# Patient Record
Sex: Female | Born: 1965 | Race: Black or African American | Hispanic: No | Marital: Married | Smoking: Former smoker
Health system: Southern US, Community
[De-identification: ages and names within clinical notes are randomized; demographics above are authoritative.]

## PROBLEM LIST (undated history)

## (undated) DIAGNOSIS — G43909 Migraine, unspecified, not intractable, without status migrainosus: Secondary | ICD-10-CM

## (undated) DIAGNOSIS — B977 Papillomavirus as the cause of diseases classified elsewhere: Secondary | ICD-10-CM

## (undated) DIAGNOSIS — K219 Gastro-esophageal reflux disease without esophagitis: Secondary | ICD-10-CM

## (undated) DIAGNOSIS — M5430 Sciatica, unspecified side: Secondary | ICD-10-CM

## (undated) DIAGNOSIS — M719 Bursopathy, unspecified: Secondary | ICD-10-CM

## (undated) DIAGNOSIS — Z8669 Personal history of other diseases of the nervous system and sense organs: Secondary | ICD-10-CM

## (undated) DIAGNOSIS — F419 Anxiety disorder, unspecified: Secondary | ICD-10-CM

## (undated) DIAGNOSIS — L309 Dermatitis, unspecified: Secondary | ICD-10-CM

## (undated) DIAGNOSIS — Z9289 Personal history of other medical treatment: Secondary | ICD-10-CM

## (undated) DIAGNOSIS — R112 Nausea with vomiting, unspecified: Secondary | ICD-10-CM

## (undated) HISTORY — PX: TUBAL LIGATION: SHX77

## (undated) HISTORY — DX: Migraine, unspecified, not intractable, without status migrainosus: G43.909

## (undated) HISTORY — PX: NOVASURE ABLATION: SHX5394

## (undated) HISTORY — DX: Papillomavirus as the cause of diseases classified elsewhere: B97.7

## (undated) HISTORY — DX: Personal history of other medical treatment: Z92.89

## (undated) HISTORY — PX: ESOPHAGOGASTRODUODENOSCOPY: SHX1529

## (undated) HISTORY — PX: WISDOM TOOTH EXTRACTION: SHX21

## (undated) HISTORY — DX: Gastro-esophageal reflux disease without esophagitis: K21.9

## (undated) HISTORY — DX: Bursopathy, unspecified: M71.9

## (undated) HISTORY — PX: OTHER SURGICAL HISTORY: SHX169

---

## 2000-03-02 ENCOUNTER — Other Ambulatory Visit: Admission: RE | Admit: 2000-03-02 | Discharge: 2000-03-02 | Payer: Self-pay | Admitting: Obstetrics and Gynecology

## 2002-02-14 ENCOUNTER — Other Ambulatory Visit: Admission: RE | Admit: 2002-02-14 | Discharge: 2002-02-14 | Payer: Self-pay | Admitting: Gynecology

## 2002-06-20 ENCOUNTER — Observation Stay (HOSPITAL_COMMUNITY): Admission: AD | Admit: 2002-06-20 | Discharge: 2002-06-21 | Payer: Self-pay | Admitting: Gynecology

## 2002-06-20 ENCOUNTER — Encounter (INDEPENDENT_AMBULATORY_CARE_PROVIDER_SITE_OTHER): Payer: Self-pay | Admitting: *Deleted

## 2003-08-01 ENCOUNTER — Other Ambulatory Visit: Admission: RE | Admit: 2003-08-01 | Discharge: 2003-08-01 | Payer: Self-pay | Admitting: Obstetrics and Gynecology

## 2004-07-14 ENCOUNTER — Other Ambulatory Visit: Admission: RE | Admit: 2004-07-14 | Discharge: 2004-07-14 | Payer: Self-pay | Admitting: Obstetrics and Gynecology

## 2005-08-30 ENCOUNTER — Other Ambulatory Visit: Admission: RE | Admit: 2005-08-30 | Discharge: 2005-08-30 | Payer: Self-pay | Admitting: Obstetrics and Gynecology

## 2008-09-05 ENCOUNTER — Encounter: Admission: RE | Admit: 2008-09-05 | Discharge: 2008-09-05 | Payer: Self-pay | Admitting: Obstetrics and Gynecology

## 2008-10-11 ENCOUNTER — Encounter: Admission: RE | Admit: 2008-10-11 | Discharge: 2008-10-11 | Payer: Self-pay | Admitting: Obstetrics and Gynecology

## 2009-03-28 ENCOUNTER — Encounter: Admission: RE | Admit: 2009-03-28 | Discharge: 2009-03-28 | Payer: Self-pay | Admitting: Obstetrics and Gynecology

## 2009-09-17 ENCOUNTER — Encounter: Admission: RE | Admit: 2009-09-17 | Discharge: 2009-09-17 | Payer: Self-pay | Admitting: Obstetrics and Gynecology

## 2010-06-21 ENCOUNTER — Encounter: Payer: Self-pay | Admitting: Obstetrics and Gynecology

## 2010-08-18 ENCOUNTER — Other Ambulatory Visit: Payer: Self-pay | Admitting: Obstetrics and Gynecology

## 2010-08-18 DIAGNOSIS — N63 Unspecified lump in unspecified breast: Secondary | ICD-10-CM

## 2010-09-21 ENCOUNTER — Ambulatory Visit
Admission: RE | Admit: 2010-09-21 | Discharge: 2010-09-21 | Disposition: A | Discharge status: Home / Self Care | Payer: BC Managed Care – PPO | Source: Ambulatory Visit | Attending: Obstetrics and Gynecology | Admitting: Obstetrics and Gynecology

## 2010-09-21 ENCOUNTER — Other Ambulatory Visit: Payer: Self-pay | Admitting: Obstetrics and Gynecology

## 2010-09-21 DIAGNOSIS — N63 Unspecified lump in unspecified breast: Secondary | ICD-10-CM

## 2010-10-16 NOTE — Op Note (Signed)
NAME:  Julia Shelton, Julia Shelton NO.:  [id number]   MEDICAL RECORD NO.:  [id number]                  PATIENT TYPE:   LOCATION:                                       FACILITY:   PHYSICIAN:  Gretta Cool, M.D.              DATE OF BIRTH:   DATE OF PROCEDURE:  DATE OF DISCHARGE:                                 OPERATIVE REPORT   PREOPERATIVE DIAGNOSES:  Ectopic pregnancy, left, with hemoperitoneum.   POSTOPERATIVE DIAGNOSES:  1. Ectopic pregnancy, left, with hemoperitoneum.  2. Endometriosis with uteroperitoneal fistula.  3. History of tubal sterilization elsewhere in 1996.   PROCEDURE:  Diagnostic laparoscopy. Evacuation of hemoperitoneum. Left  partial salpingectomy. Filshie clip application to the left fallopian tube  stump. Lysis of right ovarian adhesions.   SURGEON:  Beather Arbour, M.D.   ANESTHESIA:  General orotracheal.   DESCRIPTION OF PROCEDURE:  Under excellent anesthesias as above with the  patient prepped and draped in Phalen stirrups, the Hulka tenaculum was  applied to the cervix and the bladder drained. A subumbilical incision was  made and cannula introduced into the peritoneum with 2 liters of carbon  dioxide. The laparoscope trochar was introduced and pelvic organs  visualized. The hemoperitoneum was confirmed as the left ectopic pregnancy  was identified in the distal segment of the left fallopian tube. The patient  had had previous sterilization procedure, apparently by cautery. There was a  long proximal segment of fallopian tube left on the left side. The right  side was apparently cauterized into the myometrium and there was no evidence  of residual fallopian tube proximal segment on the right. Accessory ports  were placed and tripolar forceps used to excise the distal segment of the  left fallopian tube. The clot was removed from around the left ovary and the  hemoperitoneum evacuated completely. Pelvis was irrigated to remove  all  debris. The left fallopian tube distal segment was removed through the 10 mm  port site. At this point, the __ tripolar forceps were used to excise  adhesions from the small bowel to the ovary and the stump of the right  fallopian tube at the previous tubal sterilization site. Careful examination  of the pelvis revealed evidence of uteroperitoneal reflux, menstrual blood,  and endometriosis in the cul-de-sac. At this point, careful examination of  all the pedicles revealed no intraperitoneal bleeding from the tubal  excision. The procedure was then terminated. Gas was allowed to escape and  approximately 500 cc of fluid left in the pelvis to prevent adhesion  formation. At this point, the incisions were closed with deep suture of 2-0  Vicryl and skin closure of 5-0 Vicryl and steri-strips. At the end of the  procedure, sponge and lap counts were correct. There were no complications.  The patient was returned to the recovery room  in excellent condition.  Gretta Cool, M.D.    CWL/MEDQ  D:  06/20/2002  T:  06/20/2002  Job:  [id number]

## 2011-08-23 ENCOUNTER — Other Ambulatory Visit: Payer: Self-pay | Admitting: Obstetrics and Gynecology

## 2011-08-23 DIAGNOSIS — Z1231 Encounter for screening mammogram for malignant neoplasm of breast: Secondary | ICD-10-CM

## 2011-09-23 ENCOUNTER — Ambulatory Visit
Admission: RE | Admit: 2011-09-23 | Discharge: 2011-09-23 | Disposition: A | Discharge status: Home / Self Care | Payer: BC Managed Care – PPO | Source: Ambulatory Visit | Attending: Obstetrics and Gynecology | Admitting: Obstetrics and Gynecology

## 2011-09-23 DIAGNOSIS — Z1231 Encounter for screening mammogram for malignant neoplasm of breast: Secondary | ICD-10-CM

## 2011-11-01 ENCOUNTER — Ambulatory Visit (INDEPENDENT_AMBULATORY_CARE_PROVIDER_SITE_OTHER): Payer: BC Managed Care – PPO | Admitting: Obstetrics and Gynecology

## 2011-11-01 ENCOUNTER — Encounter: Payer: Self-pay | Admitting: Obstetrics and Gynecology

## 2011-11-01 VITALS — BP 120/72 | HR 74 | Ht 64.0 in | Wt 175.0 lb

## 2011-11-01 DIAGNOSIS — M719 Bursopathy, unspecified: Secondary | ICD-10-CM | POA: Insufficient documentation

## 2011-11-01 DIAGNOSIS — N898 Other specified noninflammatory disorders of vagina: Secondary | ICD-10-CM

## 2011-11-01 DIAGNOSIS — A549 Gonococcal infection, unspecified: Secondary | ICD-10-CM | POA: Insufficient documentation

## 2011-11-01 DIAGNOSIS — N83209 Unspecified ovarian cyst, unspecified side: Secondary | ICD-10-CM | POA: Insufficient documentation

## 2011-11-01 DIAGNOSIS — O009 Unspecified ectopic pregnancy without intrauterine pregnancy: Secondary | ICD-10-CM | POA: Insufficient documentation

## 2011-11-01 DIAGNOSIS — Z8669 Personal history of other diseases of the nervous system and sense organs: Secondary | ICD-10-CM

## 2011-11-01 DIAGNOSIS — Z01419 Encounter for gynecological examination (general) (routine) without abnormal findings: Secondary | ICD-10-CM

## 2011-11-01 DIAGNOSIS — S92919A Unspecified fracture of unspecified toe(s), initial encounter for closed fracture: Secondary | ICD-10-CM | POA: Insufficient documentation

## 2011-11-01 DIAGNOSIS — IMO0002 Reserved for concepts with insufficient information to code with codable children: Secondary | ICD-10-CM | POA: Insufficient documentation

## 2011-11-01 DIAGNOSIS — K219 Gastro-esophageal reflux disease without esophagitis: Secondary | ICD-10-CM | POA: Insufficient documentation

## 2011-11-01 DIAGNOSIS — Z124 Encounter for screening for malignant neoplasm of cervix: Secondary | ICD-10-CM

## 2011-11-01 MED ORDER — AMBULATORY NON FORMULARY MEDICATION: 1.0000 | Status: DC

## 2011-11-01 NOTE — Progress Notes (Signed)
Regular Periods: yes Mammogram: yes  Monthly Breast Ex.: yes Exercise: yes  Tetanus < 10 years: no Seatbelts: yes  NI. Bladder Functn.: yes Abuse at home: no  Daily BM's: yes Stressful Work: yes  Healthy Diet: yes Sigmoid-Colonoscopy: NO  Calcium: no Medical problems this year: NO PROBLEMS   LAST PAP:5/12 NL  Contraception: BTL  Mammogram:  4/13 NL  PCP: DR. Aram Beecham WHITE  PMH: BROKEN TOE(RIGHT BIG TOE),BURSITIS IN HIP AND RIGHT SHOULDER  FMH: NO CHANGE  Last Bone Scan: NO

## 2011-11-01 NOTE — Progress Notes (Signed)
Subjective:    Julia Shelton is a 46 y.o. female, [id number], who presents for an annual exam. The patient has no complaints but due to history of recurrent bacterial vaginosis, wants to be checked for this.  Menstrual cycle:   LMP: Patient's last menstrual period was 10/21/2011.             Review of Systems Pertinent items are noted in HPI. Denies pelvic pain, urinary tract symptoms, vaginitis symptoms, irregular bleeding, menopausal symptoms, change in bowel habits or rectal bleeding   Objective:    BP 120/72  Pulse 74  Ht 5\' 4"  (1.626 m)  Wt 175 lb (79.379 kg)  BMI 30.04 kg/m2  LMP 10/21/2011   Wt Readings from Last 1 Encounters:  11/01/11 175 lb (79.379 kg)   Body mass index is 30.04 kg/(m^2). General Appearance: Alert, no acute distress HEENT: Grossly normal Neck / Thyroid: Supple, no thyromegaly or cervical adenopathy Lungs: Clear to auscultation bilaterally Back: No CVA tenderness Breast Exam: No masses or nodes.No dimpling, nipple retraction or discharge. Cardiovascular: Regular rate and rhythm.  Gastrointestinal: Soft, non-tender, no masses or organomegaly Pelvic Exam: EGBUS-wnl, vagina-normal rugae, cervix- without lesions or tenderness, uterus ULNS, adnexae-no masses or tenderness Rectovaginal: no masses and normal sphincter tone Lymphatic Exam: Non-palpable nodes in neck, clavicular,  axillary, or inguinal regions  Skin: no rashes or abnormalities Extremities: no clubbing cyanosis or edema  Neurologic: grossly normal Psychiatric: Alert and oriented  Wet Prep:  pH 5.0; whiff-negative; no clue, trich or yeast    Assessment:   Routine GYN Exam H/O Recurrent Bacterial Vaginosis Shift in vaginal pH   Plan:    PAP sent  RTO 1 year or prn  Boric Acid Capsules 600 mg  # 30 1 pv twice weekly 11rf  Tommie Bohlken,ELMIRAPA-C

## 2011-11-03 LAB — PAP IG W/ RFLX HPV ASCU

## 2011-12-22 ENCOUNTER — Encounter: Payer: Self-pay | Admitting: Obstetrics and Gynecology

## 2012-01-10 ENCOUNTER — Telehealth: Payer: Self-pay | Admitting: Obstetrics and Gynecology

## 2012-01-10 ENCOUNTER — Ambulatory Visit (INDEPENDENT_AMBULATORY_CARE_PROVIDER_SITE_OTHER): Payer: BC Managed Care – PPO | Admitting: Obstetrics and Gynecology

## 2012-01-10 ENCOUNTER — Encounter: Payer: Self-pay | Admitting: Obstetrics and Gynecology

## 2012-01-10 VITALS — BP 100/68 | HR 70 | Wt 174.0 lb

## 2012-01-10 DIAGNOSIS — N760 Acute vaginitis: Secondary | ICD-10-CM

## 2012-01-10 DIAGNOSIS — N899 Noninflammatory disorder of vagina, unspecified: Secondary | ICD-10-CM

## 2012-01-10 DIAGNOSIS — N898 Other specified noninflammatory disorders of vagina: Secondary | ICD-10-CM

## 2012-01-10 DIAGNOSIS — N762 Acute vulvitis: Secondary | ICD-10-CM

## 2012-01-10 LAB — POCT WET PREP (WET MOUNT): pH: 4.5

## 2012-01-10 MED ORDER — NYSTATIN-TRIAMCINOLONE 100000-0.1 UNIT/GM-% EX OINT: TOPICAL_OINTMENT | Freq: Two times a day (BID) | CUTANEOUS | Status: AC

## 2012-01-10 NOTE — Patient Instructions (Signed)
Avoid: - excess soap on genital area (consider using plain oatmeal soap) - use of powder or sprays in genital area - douching - wearing underwear to bed (except with menses) - using more than is directed detergent when washing clothes - tight fitting garments around genital area - excess sugar intake   

## 2012-01-10 NOTE — Progress Notes (Signed)
[name] complains of vaginal irritation x 1 week.  Admits to lounging in a wet swimsuit, but denies any actual vaginal discharge, odor, change in soaps/detergents.  Used some anti-itch cream with temporary results.  O:  Pelvic:  EGBUS-inflamed with mile edema, vagina-white discharge, cervix-no lesions, uterus-normal size; adnexae-no masses   Wet Prep;  pH-4.5 whiff-negative; no clue, trich or yeast  A: Vulvitis  P:  Mycolog II Cream (generic) topically on affected area bid x 7-14 days no refills       Perineal hygiene, use hands to bathe until irritation resolves along with oatmeal soap       RTO-as scheduled or prn.  Julia Wisner, PA-C

## 2012-01-10 NOTE — Progress Notes (Signed)
  Odor: no Itching:yes Thin:no Thick:no Fever:no Dyspareunia:no Hx PID:no HX STD:yes HPV Pelvic Pain:no Desires Gc/CT:no Desires HIV,RPR,HbsAG:no

## 2012-10-24 ENCOUNTER — Other Ambulatory Visit: Payer: Self-pay

## 2012-10-24 DIAGNOSIS — Z1231 Encounter for screening mammogram for malignant neoplasm of breast: Secondary | ICD-10-CM

## 2012-11-24 ENCOUNTER — Ambulatory Visit
Admission: RE | Admit: 2012-11-24 | Discharge: 2012-11-24 | Disposition: A | Discharge status: Home / Self Care | Payer: BC Managed Care – PPO | Source: Ambulatory Visit

## 2012-11-24 DIAGNOSIS — Z1231 Encounter for screening mammogram for malignant neoplasm of breast: Secondary | ICD-10-CM

## 2013-05-08 ENCOUNTER — Ambulatory Visit: Payer: BC Managed Care – PPO | Admitting: Podiatry

## 2013-11-13 ENCOUNTER — Other Ambulatory Visit: Payer: Self-pay

## 2013-11-13 DIAGNOSIS — Z1231 Encounter for screening mammogram for malignant neoplasm of breast: Secondary | ICD-10-CM

## 2013-12-07 ENCOUNTER — Ambulatory Visit: Payer: BC Managed Care – PPO

## 2013-12-28 ENCOUNTER — Encounter (INDEPENDENT_AMBULATORY_CARE_PROVIDER_SITE_OTHER): Payer: Self-pay

## 2013-12-28 ENCOUNTER — Ambulatory Visit
Admission: RE | Admit: 2013-12-28 | Discharge: 2013-12-28 | Disposition: A | Discharge status: Home / Self Care | Payer: BC Managed Care – PPO | Source: Ambulatory Visit

## 2013-12-28 DIAGNOSIS — Z1231 Encounter for screening mammogram for malignant neoplasm of breast: Secondary | ICD-10-CM

## 2014-02-09 ENCOUNTER — Emergency Department (HOSPITAL_COMMUNITY)
Admission: EM | Admit: 2014-02-09 | Discharge: 2014-02-09 | Disposition: A | Discharge status: Home / Self Care | Payer: BC Managed Care – PPO | Attending: Emergency Medicine | Admitting: Emergency Medicine

## 2014-02-09 ENCOUNTER — Encounter (HOSPITAL_COMMUNITY): Payer: Self-pay | Admitting: Emergency Medicine

## 2014-02-09 ENCOUNTER — Emergency Department (HOSPITAL_COMMUNITY): Payer: BC Managed Care – PPO

## 2014-02-09 ENCOUNTER — Emergency Department (INDEPENDENT_AMBULATORY_CARE_PROVIDER_SITE_OTHER)
Admission: EM | Admit: 2014-02-09 | Discharge: 2014-02-09 | Disposition: A | Discharge status: Home / Self Care | Payer: BC Managed Care – PPO | Source: Home / Self Care

## 2014-02-09 DIAGNOSIS — Z8781 Personal history of (healed) traumatic fracture: Secondary | ICD-10-CM | POA: Diagnosis not present

## 2014-02-09 DIAGNOSIS — Z8742 Personal history of other diseases of the female genital tract: Secondary | ICD-10-CM | POA: Diagnosis not present

## 2014-02-09 DIAGNOSIS — F3289 Other specified depressive episodes: Secondary | ICD-10-CM | POA: Insufficient documentation

## 2014-02-09 DIAGNOSIS — K219 Gastro-esophageal reflux disease without esophagitis: Secondary | ICD-10-CM

## 2014-02-09 DIAGNOSIS — Z8679 Personal history of other diseases of the circulatory system: Secondary | ICD-10-CM | POA: Diagnosis not present

## 2014-02-09 DIAGNOSIS — F411 Generalized anxiety disorder: Secondary | ICD-10-CM | POA: Insufficient documentation

## 2014-02-09 DIAGNOSIS — Z8619 Personal history of other infectious and parasitic diseases: Secondary | ICD-10-CM | POA: Diagnosis not present

## 2014-02-09 DIAGNOSIS — F329 Major depressive disorder, single episode, unspecified: Secondary | ICD-10-CM | POA: Insufficient documentation

## 2014-02-09 DIAGNOSIS — F419 Anxiety disorder, unspecified: Secondary | ICD-10-CM

## 2014-02-09 DIAGNOSIS — Z79899 Other long term (current) drug therapy: Secondary | ICD-10-CM | POA: Diagnosis not present

## 2014-02-09 DIAGNOSIS — Z8739 Personal history of other diseases of the musculoskeletal system and connective tissue: Secondary | ICD-10-CM | POA: Diagnosis not present

## 2014-02-09 LAB — CBC WITH DIFFERENTIAL/PLATELET
Basophils Absolute: 0 10*3/uL (ref 0.0–0.1)
Basophils Relative: 0 % (ref 0–1)
Eosinophils Absolute: 0.1 10*3/uL (ref 0.0–0.7)
Eosinophils Relative: 2 % (ref 0–5)
HCT: 36.9 % (ref 36.0–46.0)
Hemoglobin: 13.2 g/dL (ref 12.0–15.0)
LYMPHS ABS: 2.6 10*3/uL (ref 0.7–4.0)
LYMPHS PCT: 36 % (ref 12–46)
MCH: 30.5 pg (ref 26.0–34.0)
MCHC: 35.8 g/dL (ref 30.0–36.0)
MCV: 85.2 fL (ref 78.0–100.0)
Monocytes Absolute: 0.7 10*3/uL (ref 0.1–1.0)
Monocytes Relative: 9 % (ref 3–12)
NEUTROS ABS: 3.9 10*3/uL (ref 1.7–7.7)
NEUTROS PCT: 53 % (ref 43–77)
PLATELETS: 207 10*3/uL (ref 150–400)
RBC: 4.33 MIL/uL (ref 3.87–5.11)
RDW: 13 % (ref 11.5–15.5)
WBC: 7.3 10*3/uL (ref 4.0–10.5)

## 2014-02-09 LAB — BASIC METABOLIC PANEL
Anion gap: 14 (ref 5–15)
BUN: 8 mg/dL (ref 6–23)
CHLORIDE: 101 meq/L (ref 96–112)
CO2: 25 meq/L (ref 19–32)
Calcium: 9.4 mg/dL (ref 8.4–10.5)
Creatinine, Ser: 0.98 mg/dL (ref 0.50–1.10)
GFR calc Af Amer: 79 mL/min — ABNORMAL LOW (ref >90)
GFR, EST NON AFRICAN AMERICAN: 68 mL/min — AB (ref >90)
GLUCOSE: 90 mg/dL (ref 70–99)
POTASSIUM: 4.2 meq/L (ref 3.7–5.3)
SODIUM: 140 meq/L (ref 137–147)

## 2014-02-09 MED ORDER — SUCRALFATE 1 G PO TABS: 1.0000 g | ORAL_TABLET | Freq: Three times a day (TID) | ORAL | Status: DC

## 2014-02-09 MED ORDER — OMEPRAZOLE 20 MG PO CPDR: 20.0000 mg | DELAYED_RELEASE_CAPSULE | Freq: Two times a day (BID) | ORAL | Status: DC

## 2014-02-09 MED ORDER — FLUOXETINE HCL 10 MG PO CAPS: 10.0000 mg | ORAL_CAPSULE | Freq: Every day | ORAL | Status: DC

## 2014-02-09 MED ORDER — ALPRAZOLAM 0.5 MG PO TABS: 0.5000 mg | ORAL_TABLET | Freq: Every evening | ORAL | Status: DC | PRN

## 2014-02-09 NOTE — ED Notes (Signed)
Pt reports having multiple episodes of anxiety attack, which she reports she has a history of anxiety. Pt states that her anxiety has increased over the past few years. Pt also is requesting an evaluation of her gastric reflux which has also worsened, pt reports throat pain and has been taking medication, which has decreased the reflux. Pt states that she has had decreased appetite due to the reflux. Pt was sent from urgent care for evaluation.

## 2014-02-09 NOTE — ED Notes (Signed)
Pt is ambulatory upon discharge and verbalizing that she is leaving with all belongings that she arrived with.

## 2014-02-09 NOTE — Discharge Instructions (Signed)
Gastroesophageal Reflux Disease, Adult Gastroesophageal reflux disease (GERD) happens when acid from your stomach flows up into the esophagus. When acid comes in contact with the esophagus, the acid causes soreness (inflammation) in the esophagus. Over time, GERD may create small holes (ulcers) in the lining of the esophagus. CAUSES   Increased body weight. This puts pressure on the stomach, making acid rise from the stomach into the esophagus.  Smoking. This increases acid production in the stomach.  Drinking alcohol. This causes decreased pressure in the lower esophageal sphincter (valve or ring of muscle between the esophagus and stomach), allowing acid from the stomach into the esophagus.  Late evening meals and a full stomach. This increases pressure and acid production in the stomach.  A malformed lower esophageal sphincter. Sometimes, no cause is found. SYMPTOMS   Burning pain in the lower part of the mid-chest behind the breastbone and in the mid-stomach area. This may occur twice a week or more often.  Trouble swallowing.  Sore throat.  Dry cough.  Asthma-like symptoms including chest tightness, shortness of breath, or wheezing. DIAGNOSIS  Your caregiver may be able to diagnose GERD based on your symptoms. In some cases, X-rays and other tests may be done to check for complications or to check the condition of your stomach and esophagus. TREATMENT  Your caregiver may recommend over-the-counter or prescription medicines to help decrease acid production. Ask your caregiver before starting or adding any new medicines.  HOME CARE INSTRUCTIONS   Change the factors that you can control. Ask your caregiver for guidance concerning weight loss, quitting smoking, and alcohol consumption.  Avoid foods and drinks that make your symptoms worse, such as:  Caffeine or alcoholic drinks.  Chocolate.  Peppermint or mint flavorings.  Garlic and onions.  Spicy foods.  Citrus fruits,  such as oranges, lemons, or limes.  Tomato-based foods such as sauce, chili, salsa, and pizza.  Fried and fatty foods.  Avoid lying down for the 3 hours prior to your bedtime or prior to taking a nap.  Eat small, frequent meals instead of large meals.  Wear loose-fitting clothing. Do not wear anything tight around your waist that causes pressure on your stomach.  Raise the head of your bed 6 to 8 inches with wood blocks to help you sleep. Extra pillows will not help.  Only take over-the-counter or prescription medicines for pain, discomfort, or fever as directed by your caregiver.  Do not take aspirin, ibuprofen, or other nonsteroidal anti-inflammatory drugs (NSAIDs). SEEK IMMEDIATE MEDICAL CARE IF:   You have pain in your arms, neck, jaw, teeth, or back.  Your pain increases or changes in intensity or duration.  You develop nausea, vomiting, or sweating (diaphoresis).  You develop shortness of breath, or you faint.  Your vomit is green, yellow, black, or looks like coffee grounds or blood.  Your stool is red, bloody, or black. These symptoms could be signs of other problems, such as heart disease, gastric bleeding, or esophageal bleeding. MAKE SURE YOU:   Understand these instructions.  Will watch your condition.  Will get help right away if you are not doing well or get worse. Document Released: 02/24/2005 Document Revised: 08/09/2011 Document Reviewed: 12/04/2010 [facility] Patient Information 2015 [place], [place]. This information is not intended to replace advice given to you by your health care provider. Make sure you discuss any questions you have with your health care provider. Generalized Anxiety Disorder Generalized anxiety disorder (GAD) is a mental disorder. It interferes with life functions,  including relationships, work, and school. GAD is different from normal anxiety, which everyone experiences at some point in their lives in response to specific life events  and activities. Normal anxiety actually helps [place] prepare for and get through these life events and activities. Normal anxiety goes away after the event or activity is over.  GAD causes anxiety that is not necessarily related to specific events or activities. It also causes excess anxiety in proportion to specific events or activities. The anxiety associated with GAD is also difficult to control. GAD can vary from mild to severe. People with severe GAD can have intense waves of anxiety with physical symptoms (panic attacks).  SYMPTOMS The anxiety and worry associated with GAD are difficult to control. This anxiety and worry are related to many life events and activities and also occur more days than not for 6 months or longer. People with GAD also have three or more of the following symptoms (one or more in children):  Restlessness.   Fatigue.  Difficulty concentrating.   Irritability.  Muscle tension.  Difficulty sleeping or unsatisfying sleep. DIAGNOSIS GAD is diagnosed through an assessment by your health care provider. Your health care provider will ask you questions aboutyour mood,physical symptoms, and events in your life. Your health care provider may ask you about your medical history and use of alcohol or drugs, including prescription medicines. Your health care provider may also do a physical exam and blood tests. Certain medical conditions and the use of certain substances can cause symptoms similar to those associated with GAD. Your health care provider may refer you to a mental health specialist for further evaluation. TREATMENT The following therapies are usually used to treat GAD:   Medication. Antidepressant medication usually is prescribed for long-term daily control. Antianxiety medicines may be added in severe cases, especially when panic attacks occur.   Talk therapy (psychotherapy). Certain types of talk therapy can be helpful in treating GAD by providing support,  education, and guidance. A form of talk therapy called cognitive behavioral therapy can teach you healthy ways to think about and react to daily life events and activities.  Stress managementtechniques. These include yoga, meditation, and exercise and can be very helpful when they are practiced regularly. A mental health specialist can help determine which treatment is best for you. Some people see improvement with one therapy. However, other people require a combination of therapies. Document Released: 09/11/2012 Document Revised: 10/01/2013 Document Reviewed: 09/11/2012 [facility] Patient Information 2015 [place], [place]. This information is not intended to replace advice given to you by your health care provider. Make sure you discuss any questions you have with your health care provider. Outpatient Psychiatry and Counseling  [facility]: Mobile Crisis Management 24 hours:  [phone]  [facility] of the [employer] sliding scale fee and walk in schedule: M-F 8am-12pm/1pm-3pm [address]  [place], [place] [address] [phone]  [facility] [address] [place], [place] [address] [phone]  [facility] ([facility] [employer])- new patient walk-in appointments available Monday - Friday 8am -3pm.          [address] [place], [place] [address] [phone] or crisis line- [phone]  [facility] Outpatient Services/ Intensive Outpatient Therapy Program [address] [place], [place] [address] [phone]  [facility] Mental Health                  Crisis Services      [phone] N[address]     [place], [place] [address]                 [facility]   [facility] [phone]. [address] [place], [place] [address]   [employer] of Care          [address] [place]  [place], [place] [address]       [phone]  [facility] [address] [place], [place] [address] [phone]  [facility]    [address]    [place], [place] [address]     [phone]       Andee Poles, MD     [address]     [place] [address]     [phone]       [facility] [address] [place] [address]  [facility]     [address] [place], [place]      [phone]       [facility] Eulogio Ditch, MD [address] [place], [place] [address] [phone]  [facility]     [address]     [place], [place] [address]     [phone]       [facility] [address] [place], [place] [address] [phone] Resources for Temporary Residential Assistance/Crisis Centers  [facility]) M-F 8am-3pm   [address] [place], [place] [address]   [phone] Services include: laundry, barbering, support groups, case management, phone  & computer access, showers, AA/NA mtgs, mental health/substance abuse nurse, job skills class, disability information, VA assistance, spiritual classes, etc.   HOMELESS SHELTERS  [facility]     [facility]   [address], [place]     [phone]              [employer] (women and children)       [address] [place], [place] [address] [phone] [email or web address] for application and process Application Required  [facility]   [address]    [place] [address]     [phone]                    [facility] of [place] [address] [place], [place] [address] [phone] application appt.) Application Required  [facility] (women only)    [address]     [place], [place] [address]     [phone]      Intake starts 6pm daily Need valid ID, SSC, & Police  report [employer] [address] [place], [place] [phone] Application Required  [employer] (men only)     [address]      [place], [place]     [phone]       [facility] [place] (Pregnant women only) [address] [place], [place] [phone]  [facility]      [address]      [place], [place] [address]     [phone]             [facility] [address] [place], [place] [phone] 90 day commitment/SA/Application process  [facility](men only)  [address]     [place], [place]     [phone]       Check-in at [facility] of [facility] [address] [place], [place] [address] [phone] Men/Women/Women and Children must be there by 7 pm  [facility] [place], [place] [phone]

## 2014-02-09 NOTE — ED Notes (Signed)
MD Lynelle Doctor At bedside.

## 2014-02-09 NOTE — ED Provider Notes (Signed)
CSN: [id number]     Arrival date & time 02/09/14  1601 History   First MD Initiated Contact with Patient 02/09/14 1712     Chief Complaint  Patient presents with  . Gastrophageal Reflux  . Anxiety   HPI The patient has had trouble with anxiety for a number of years. She has never seen anyone in the past for specific treatment. Over the last couple of weeks her symptoms have gotten worse especially in the last 2 days. She'll have episodes of crying spells. She will feel very anxious. She will also start to feel that she can't catch her breath. His episodes lasting minutes at a time. Denies any trouble suicidal or homicidal ideation.  She went to an urgent care today. The patient states she was told to come to the emergency room and decide what sort of medication she should be on.  Patient has also had issues with acid reflux. Patient states the symptoms are for discomfort and fullness in the center of the chest. She denies any abdominal pain. She has had decreased appetite. She denies any fevers constipation or diarrhea. Past Medical History  Diagnosis Date  . History of blood transfusion   . GERD (gastroesophageal reflux disease)   . H/O menorrhagia   . Migraines   . HPV in female   . Abnormal pap   . GC (gonococcus infection)   . Ectopic fetus   . Ovarian cyst   . Bursitis     RIGHT HIP AND SHOULDER  . Broken toe    Past Surgical History  Procedure Laterality Date  . Laparoscopy      D AND C  . Tubal ligation    . Wisdom tooth extraction    . Novasure ablation     Family History  Problem Relation Age of Onset  . Hyperlipidemia Mother   . Asthma Mother   . Diabetes Father   . Hypertension Father   . Sickle cell trait Daughter   . Celiac disease Son    History  Substance Use Topics  . Smoking status: Never Smoker   . Smokeless tobacco: Never Used  . Alcohol Use: Yes     Comment: social   OB History   Grav Para Term Preterm Abortions TAB SAB Ect Mult Living   Review of Systems  All other systems reviewed and are negative.     Allergies  Biaxin  Home Medications   Prior to Admission medications   Medication Sig Start Date End Date Taking? Authorizing Provider  esomeprazole (NEXIUM) 40 MG capsule Take 40 mg by mouth daily at 12 noon.   Yes Historical Provider, MD  Multiple Vitamin (MULTIVITAMIN WITH MINERALS) TABS tablet Take 1 tablet by mouth daily.   Yes Historical Provider, MD  ALPRAZolam Prudy Feeler) 0.5 MG tablet Take 1 tablet (0.5 mg total) by mouth at bedtime as needed for anxiety. 02/09/14   Linwood Dibbles, MD  FLUoxetine (PROZAC) 10 MG capsule Take 1 capsule (10 mg total) by mouth daily. Follow up with your doctor about increasing the dose if needed after one week 02/09/14   Linwood Dibbles, MD  omeprazole (PRILOSEC) 20 MG capsule Take 1 capsule (20 mg total) by mouth 2 (two) times daily before a meal. 02/09/14   Linwood Dibbles, MD   BP 120/73  Pulse 74  Temp(Src) 97.8 F (36.6 C) (Oral)  Resp 16  SpO2 100%  LMP 02/05/2014  Physical Exam  Nursing note and vitals reviewed. Constitutional: She appears well-developed and well-nourished. No distress.  HENT:  Head: Normocephalic and atraumatic.  Right Ear: External ear normal.  Left Ear: External ear normal.  Eyes: Conjunctivae are normal. Right eye exhibits no discharge. Left eye exhibits no discharge. No scleral icterus.  Neck: Neck supple. No tracheal deviation present.  Cardiovascular: Normal rate, regular rhythm and intact distal pulses.   Pulmonary/Chest: Effort normal and breath sounds normal. No stridor. No respiratory distress. She has no wheezes. She has no rales.  Abdominal: Soft. Bowel sounds are normal. She exhibits no distension. There is no tenderness. There is no rebound and no guarding.  Musculoskeletal: She exhibits no edema and no tenderness.  Neurological: She is alert. She has normal strength. No cranial nerve deficit (no facial droop, extraocular movements intact, no  slurred speech) or sensory deficit. She exhibits normal muscle tone. She displays no seizure activity. Coordination normal.  Skin: Skin is warm and dry. No rash noted.  Psychiatric: Her mood appears not anxious. Her affect is not angry. Her speech is not slurred. She is not agitated and not slowed. She exhibits a depressed mood. She expresses no homicidal and no suicidal ideation. She expresses no suicidal plans and no homicidal plans. She is communicative.    ED Course  Procedures (including critical care time) Labs Review Labs Reviewed  BASIC METABOLIC PANEL - Abnormal; Notable for the following:    GFR calc non Af Amer 68 (*)    GFR calc Af Amer 79 (*)    All other components within normal limits  CBC WITH DIFFERENTIAL    Imaging Review Dg Chest 2 View  02/09/2014   CLINICAL DATA:  Anxiety.  Chest pain.  EXAM: CHEST  2 VIEW  COMPARISON:  None  FINDINGS: The heart size and mediastinal contours are within normal limits. Both lungs are clear. The visualized skeletal structures are unremarkable.  IMPRESSION: No active cardiopulmonary disease.   Electronically Signed   By: Signa Kell M.D.   On: 02/09/2014 18:29     EKG Interpretation   Date/Time:  Saturday February 09 2014 18:05:13 EDT Ventricular Rate:  73 PR Interval:  173 QRS Duration: 77 QT Interval:  391 QTC Calculation: 431 R Axis:   60 Text Interpretation:  Sinus rhythm Atrial premature complex Baseline  wander in lead(s) V6 No significant change since last tracing Confirmed by  Croy Drumwright  MD-J, Jaslyne Beeck ([id number]) on 02/09/2014 6:22:08 PM      MDM   Final diagnoses:  Gastroesophageal reflux disease, esophagitis presence not specified  Anxiety    Patient mentions GERD symptoms. I will discharge her home with a prescription for Prilosec.  I recommend follow up with her primary doctor.  Patient also has been having trouble with anxiety and depression. She is not suicidal or homicidal. She does not meet criteria for inpatient  hospitalization.  I will start her on a low dose of Prozac. Xanax when necessary. Give her a resource guide to establish outpatient followup with a psychiatrist or therapist    Linwood Dibbles, MD 02/09/14 1900

## 2014-02-09 NOTE — Discharge Instructions (Signed)
Go to [facility] for counselling and help with anxiety.

## 2014-02-09 NOTE — ED Notes (Signed)
Reports having crying spells.  Feeling overwhelmed.  Sob.  Denies any suicidal or homicidal thoughts.

## 2014-02-09 NOTE — ED Notes (Signed)
MD at bedside. 

## 2014-02-09 NOTE — ED Provider Notes (Signed)
CSN: [id number]     Arrival date & time 02/09/14  1444 History   None    Chief Complaint  Patient presents with  . Anxiety   (Consider location/radiation/quality/duration/timing/severity/associated sxs/prior Treatment) Patient is a 48 y.o. female presenting with anxiety. The history is provided by the patient.  Anxiety This is a new problem. The current episode started 2 days ago. The problem has been gradually worsening. Pertinent negatives include no chest pain and no abdominal pain. Associated symptoms comments: Not sleeping well, no prior hx of same.pt uncertain of cause--maybe hormones. Crying spells. No caffeine,no suicidal or homicidal thoughts.. She has tried nothing for the symptoms.    Past Medical History  Diagnosis Date  . History of blood transfusion   . GERD (gastroesophageal reflux disease)   . H/O menorrhagia   . Migraines   . HPV in female   . Abnormal pap   . GC (gonococcus infection)   . Ectopic fetus   . Ovarian cyst   . Bursitis     RIGHT HIP AND SHOULDER  . Broken toe    Past Surgical History  Procedure Laterality Date  . Laparoscopy      D AND C  . Tubal ligation    . Wisdom tooth extraction    . Novasure ablation     Family History  Problem Relation Age of Onset  . Hyperlipidemia Mother   . Asthma Mother   . Diabetes Father   . Hypertension Father   . Sickle cell trait Daughter   . Celiac disease Son    History  Substance Use Topics  . Smoking status: Never Smoker   . Smokeless tobacco: Never Used  . Alcohol Use: Yes     Comment: social   OB History   Grav Para Term Preterm Abortions TAB SAB Ect Mult Living   Review of Systems  Constitutional: Negative.   Cardiovascular: Negative for chest pain.  Gastrointestinal: Negative.  Negative for abdominal pain.  Psychiatric/Behavioral: Positive for sleep disturbance. Negative for suicidal ideas and self-injury. The patient is nervous/anxious.     Allergies   Biaxin  Home Medications   Prior to Admission medications   Medication Sig Start Date End Date Taking? Authorizing Provider  AMBULATORY NON FORMULARY MEDICATION Place 1 capsule vaginally 2 (two) times a week. Medication Name: Boric Acid Capsules 11/01/11   Henreitta Leber, PA-C  meloxicam (MOBIC) 7.5 MG tablet Take 7.5 mg by mouth daily.    Historical Provider, MD  Multiple Vitamin (MULTIVITAMIN) tablet Take 1 tablet by mouth daily.    Historical Provider, MD  Ranitidine & Diet Manage Prod (RANITIDINE-NUTRITIONAL SUPL) 150 MG MISC Take by mouth.    Historical Provider, MD   BP 116/79  Pulse 99  Temp(Src) 98.6 F (37 C) (Oral)  Resp 16  SpO2 99%  LMP 02/05/2014 Physical Exam  Nursing note and vitals reviewed. Constitutional: She is oriented to person, place, and time. She appears well-developed and well-nourished. She appears distressed.  Eyes: EOM are normal. Pupils are equal, round, and reactive to light.  Neck: Normal range of motion. Neck supple.  Cardiovascular: Normal heart sounds.   Pulmonary/Chest: Breath sounds normal.  Lymphadenopathy:    She has no cervical adenopathy.  Neurological: She is alert and oriented to person, place, and time. No cranial nerve deficit.  Skin: Skin is warm and dry.  Psychiatric: She has a normal mood and affect. Her behavior  is normal. Judgment and thought content normal.  Tearful ,depressed appearing.    ED Course  Procedures (including critical care time) Labs Review Labs Reviewed - No data to display  Imaging Review No results found.   MDM   1. Generalized anxiety disorder       Linna Hoff, MD 02/09/14 [phone]

## 2014-02-13 ENCOUNTER — Emergency Department (HOSPITAL_COMMUNITY): Payer: BC Managed Care – PPO

## 2014-02-13 ENCOUNTER — Encounter (HOSPITAL_COMMUNITY): Payer: Self-pay | Admitting: Emergency Medicine

## 2014-02-13 ENCOUNTER — Emergency Department (HOSPITAL_COMMUNITY)
Admission: EM | Admit: 2014-02-13 | Discharge: 2014-02-13 | Disposition: A | Discharge status: Home / Self Care | Payer: BC Managed Care – PPO | Attending: Emergency Medicine | Admitting: Emergency Medicine

## 2014-02-13 DIAGNOSIS — Z8742 Personal history of other diseases of the female genital tract: Secondary | ICD-10-CM | POA: Insufficient documentation

## 2014-02-13 DIAGNOSIS — Z8739 Personal history of other diseases of the musculoskeletal system and connective tissue: Secondary | ICD-10-CM | POA: Diagnosis not present

## 2014-02-13 DIAGNOSIS — Z8619 Personal history of other infectious and parasitic diseases: Secondary | ICD-10-CM | POA: Diagnosis not present

## 2014-02-13 DIAGNOSIS — Z9889 Other specified postprocedural states: Secondary | ICD-10-CM | POA: Insufficient documentation

## 2014-02-13 DIAGNOSIS — R748 Abnormal levels of other serum enzymes: Secondary | ICD-10-CM

## 2014-02-13 DIAGNOSIS — Z79899 Other long term (current) drug therapy: Secondary | ICD-10-CM | POA: Diagnosis not present

## 2014-02-13 DIAGNOSIS — Z87828 Personal history of other (healed) physical injury and trauma: Secondary | ICD-10-CM | POA: Insufficient documentation

## 2014-02-13 DIAGNOSIS — Z9851 Tubal ligation status: Secondary | ICD-10-CM | POA: Insufficient documentation

## 2014-02-13 DIAGNOSIS — K219 Gastro-esophageal reflux disease without esophagitis: Secondary | ICD-10-CM | POA: Diagnosis present

## 2014-02-13 LAB — COMPREHENSIVE METABOLIC PANEL
ALK PHOS: 46 U/L (ref 39–117)
ALT: 15 U/L (ref 0–35)
AST: 20 U/L (ref 0–37)
Albumin: 4.4 g/dL (ref 3.5–5.2)
Anion gap: 14 (ref 5–15)
BILIRUBIN TOTAL: 0.6 mg/dL (ref 0.3–1.2)
BUN: 5 mg/dL — ABNORMAL LOW (ref 6–23)
CALCIUM: 9.5 mg/dL (ref 8.4–10.5)
CO2: 26 meq/L (ref 19–32)
Chloride: 93 mEq/L — ABNORMAL LOW (ref 96–112)
Creatinine, Ser: 0.87 mg/dL (ref 0.50–1.10)
GFR, EST NON AFRICAN AMERICAN: 79 mL/min — AB (ref >90)
GLUCOSE: 95 mg/dL (ref 70–99)
Potassium: 3.6 mEq/L — ABNORMAL LOW (ref 3.7–5.3)
SODIUM: 133 meq/L — AB (ref 137–147)
Total Protein: 7.7 g/dL (ref 6.0–8.3)

## 2014-02-13 LAB — CBC WITH DIFFERENTIAL/PLATELET
Basophils Absolute: 0 10*3/uL (ref 0.0–0.1)
Basophils Relative: 0 % (ref 0–1)
EOS PCT: 0 % (ref 0–5)
Eosinophils Absolute: 0 10*3/uL (ref 0.0–0.7)
HEMATOCRIT: 38 % (ref 36.0–46.0)
Hemoglobin: 13.8 g/dL (ref 12.0–15.0)
LYMPHS ABS: 2.1 10*3/uL (ref 0.7–4.0)
LYMPHS PCT: 31 % (ref 12–46)
MCH: 30.8 pg (ref 26.0–34.0)
MCHC: 36.3 g/dL — ABNORMAL HIGH (ref 30.0–36.0)
MCV: 84.8 fL (ref 78.0–100.0)
Monocytes Absolute: 0.5 10*3/uL (ref 0.1–1.0)
Monocytes Relative: 8 % (ref 3–12)
Neutro Abs: 4.2 10*3/uL (ref 1.7–7.7)
Neutrophils Relative %: 61 % (ref 43–77)
PLATELETS: 251 10*3/uL (ref 150–400)
RBC: 4.48 MIL/uL (ref 3.87–5.11)
RDW: 13 % (ref 11.5–15.5)
WBC: 6.9 10*3/uL (ref 4.0–10.5)

## 2014-02-13 LAB — I-STAT TROPONIN, ED: Troponin i, poc: 0 ng/mL (ref 0.00–0.08)

## 2014-02-13 LAB — LIPASE, BLOOD: Lipase: 106 U/L — ABNORMAL HIGH (ref 11–59)

## 2014-02-13 MED ORDER — METOCLOPRAMIDE HCL 5 MG/ML IJ SOLN
10.0000 mg | Freq: Once | INTRAMUSCULAR | Status: AC
  Administered 2014-02-13: 10 mg via INTRAVENOUS
  Filled 2014-02-13: qty 2

## 2014-02-13 MED ORDER — LORAZEPAM 2 MG/ML IJ SOLN
1.0000 mg | Freq: Once | INTRAMUSCULAR | Status: DC
  Filled 2014-02-13: qty 1

## 2014-02-13 MED ORDER — SODIUM CHLORIDE 0.9 % IV BOLUS (SEPSIS)
1000.0000 mL | Freq: Once | INTRAVENOUS | Status: AC
  Administered 2014-02-13: 1000 mL via INTRAVENOUS

## 2014-02-13 NOTE — ED Notes (Addendum)
Pt states that she is taking nexium and zantac for her acid reflux, and she states that this does not help with her acid reflux. Pt states that she is only able to eat in the morning, and from then on she states that it feels like she is unable to swallow. Pt states that when she tries to swallow, she can feel the acid come up in her throat. Pt denies pain at this time, but feels like she can't swallow. Pt states that she has an appointment with a gastroenterologist tomorrow morning.

## 2014-02-13 NOTE — ED Notes (Signed)
Pt is in ultrasound.

## 2014-02-13 NOTE — ED Notes (Signed)
Pt reports hx of acid reflux, having severe reflux x 2 weeks and unable to eat any food. Was seen at [facility] on 9/12 for same and also anxiety related to symptoms. Has been taking meds as prescribed but reports no relief. Now has dry mouth and having difficulty swallowing. Airway is intact at triage and no acute distress noted.

## 2014-02-13 NOTE — ED Notes (Signed)
Pt has returned from ultrasound.  

## 2014-02-13 NOTE — Discharge Instructions (Signed)
Gastroesophageal Reflux Disease, Adult °Gastroesophageal reflux disease (GERD) happens when acid from your stomach goes into your food pipe (esophagus). The acid can cause a burning feeling in your chest. Over time, the acid can make small holes (ulcers) in your food pipe.  °HOME CARE °· Ask your doctor for advice about: °¨ Losing weight. °¨ Quitting smoking. °¨ Alcohol use. °· Avoid foods and drinks that make your problems worse. You may want to avoid: °¨ Caffeine and alcohol. °¨ Chocolate. °¨ Mints. °¨ Garlic and onions. °¨ Spicy foods. °¨ Citrus fruits, such as oranges, lemons, or limes. °¨ Foods that contain tomato, such as sauce, chili, salsa, and pizza. °¨ Fried and fatty foods. °· Avoid lying down for 3 hours before you go to bed or before you take a nap. °· Eat small meals often, instead of large meals. °· Wear loose-fitting clothing. Do not wear anything tight around your waist. °· Raise (elevate) the head of your bed 6 to 8 inches with wood blocks. Using extra pillows does not help. °· Only take medicines as told by your doctor. °· Do not take aspirin or ibuprofen. °GET HELP RIGHT AWAY IF:  °· You have pain in your arms, neck, jaw, teeth, or back. °· Your pain gets worse or changes. °· You feel sick to your stomach (nauseous), throw up (vomit), or sweat (diaphoresis). °· You feel short of breath, or you pass out (faint). °· Your throw up is green, yellow, black, or looks like coffee grounds or blood. °· Your poop (stool) is red, bloody, or black. °MAKE SURE YOU:  °· Understand these instructions. °· Will watch your condition. °· Will get help right away if you are not doing well or get worse. °Document Released: 11/03/2007 Document Revised: 08/09/2011 Document Reviewed: 12/04/2010 °[employer]® Patient Information ©2015 [employer]. This information is not intended to replace advice given to you by your health care provider. Make sure you discuss any questions you have with your health care provider. ° °

## 2014-02-15 ENCOUNTER — Other Ambulatory Visit: Payer: Self-pay | Admitting: Gastroenterology

## 2014-02-17 NOTE — ED Provider Notes (Signed)
CSN: [id number]     Arrival date & time 02/13/14  1529 History   First MD Initiated Contact with Patient 02/13/14 1955     Chief Complaint  Patient presents with  . Gastrophageal Reflux  . Dysphagia     HPI Pt states that she is taking medications her acid reflux, and she states that this does not help with her acid reflux. Pt states that she is only able to eat in the morning, and from then on she states that it feels like she is unable to swallow. Pt states that when she tries to swallow, she can feel the acid come up in her throat. Pt denies pain at this time, but feels like she can't swallow. Pt states that she has an appointment with a gastroenterologist tomorrow morning.  Patient denies significant alcohol use.  Has no history of gallstones.  Denies diarrhea.  Denies fever.  Past Medical History  Diagnosis Date  . History of blood transfusion   . GERD (gastroesophageal reflux disease)   . H/O menorrhagia   . Migraines   . HPV in female   . Abnormal pap   . GC (gonococcus infection)   . Ectopic fetus   . Ovarian cyst   . Bursitis     RIGHT HIP AND SHOULDER  . Broken toe    Past Surgical History  Procedure Laterality Date  . Laparoscopy      D AND C  . Tubal ligation    . Wisdom tooth extraction    . Novasure ablation     Family History  Problem Relation Age of Onset  . Hyperlipidemia Mother   . Asthma Mother   . Diabetes Father   . Hypertension Father   . Sickle cell trait Daughter   . Celiac disease Son    History  Substance Use Topics  . Smoking status: Never Smoker   . Smokeless tobacco: Never Used  . Alcohol Use: Yes     Comment: social   OB History   Grav Para Term Preterm Abortions TAB SAB Ect Mult Living   Review of Systems  All other systems reviewed and are negative.     Allergies  Biaxin  Home Medications   Prior to Admission medications   Medication Sig Start Date End Date Taking? Authorizing Provider  ALPRAZolam  Prudy Feeler) 0.5 MG tablet Take 0.5 mg by mouth at bedtime as needed for anxiety or sleep.   Yes Historical Provider, MD  esomeprazole (NEXIUM) 40 MG capsule Take 40 mg by mouth daily at 12 noon.   Yes Historical Provider, MD  FLUoxetine (PROZAC) 10 MG capsule Take 10 mg by mouth daily.   Yes Historical Provider, MD  Multiple Vitamin (MULTIVITAMIN WITH MINERALS) TABS tablet Take 1 tablet by mouth daily.   Yes Historical Provider, MD  sucralfate (CARAFATE) 1 G tablet Take 1 g by mouth 4 (four) times daily.   Yes Historical Provider, MD   BP 115/69  Pulse 86  Temp(Src) 97.7 F (36.5 C) (Oral)  Resp 16  SpO2 100%  LMP 02/05/2014 Physical Exam  Nursing note and vitals reviewed. Constitutional: She is oriented to person, place, and time. She appears well-developed and well-nourished. No distress.  HENT:  Head: Normocephalic and atraumatic.  Eyes: Pupils are equal, round, and reactive to light.  Neck: Normal range of motion.  Cardiovascular: Normal rate and intact distal pulses.   Pulmonary/Chest: No respiratory  distress.  Abdominal: Normal appearance. She exhibits no distension. There is no tenderness. There is no rebound and no guarding.  Musculoskeletal: Normal range of motion.  Neurological: She is alert and oriented to person, place, and time. No cranial nerve deficit.  Skin: Skin is warm and dry. No rash noted.  Psychiatric: She has a normal mood and affect. Her behavior is normal.    ED Course  Procedures (including critical care time) Labs Review Labs Reviewed  CBC WITH DIFFERENTIAL - Abnormal; Notable for the following:    MCHC 36.3 (*)    All other components within normal limits  COMPREHENSIVE METABOLIC PANEL - Abnormal; Notable for the following:    Sodium 133 (*)    Potassium 3.6 (*)    Chloride 93 (*)    BUN 5 (*)    GFR calc non Af Amer 79 (*)    All other components within normal limits  LIPASE, BLOOD - Abnormal; Notable for the following:    Lipase 106 (*)    All  other components within normal limits  I-STAT TROPOININ, ED    Imaging Review Ultrasound abdomen is negative for any acute intra-abdominal pathology    MDM   Final diagnoses:  Elevated lipase  Gastroesophageal reflux disease, esophagitis presence not specified        Nelia Shi, MD 02/17/14 [phone]

## 2014-02-18 ENCOUNTER — Emergency Department (HOSPITAL_COMMUNITY): Payer: BC Managed Care – PPO

## 2014-02-18 ENCOUNTER — Encounter (HOSPITAL_COMMUNITY): Payer: Self-pay | Admitting: Emergency Medicine

## 2014-02-18 ENCOUNTER — Emergency Department (HOSPITAL_COMMUNITY)
Admission: EM | Admit: 2014-02-18 | Discharge: 2014-02-19 | Disposition: A | Discharge status: Home / Self Care | Payer: BC Managed Care – PPO | Attending: Emergency Medicine | Admitting: Emergency Medicine

## 2014-02-18 ENCOUNTER — Other Ambulatory Visit: Payer: Self-pay | Admitting: Gastroenterology

## 2014-02-18 DIAGNOSIS — N12 Tubulo-interstitial nephritis, not specified as acute or chronic: Secondary | ICD-10-CM

## 2014-02-18 DIAGNOSIS — R748 Abnormal levels of other serum enzymes: Secondary | ICD-10-CM

## 2014-02-18 DIAGNOSIS — Z792 Long term (current) use of antibiotics: Secondary | ICD-10-CM | POA: Diagnosis not present

## 2014-02-18 DIAGNOSIS — Z8679 Personal history of other diseases of the circulatory system: Secondary | ICD-10-CM | POA: Insufficient documentation

## 2014-02-18 DIAGNOSIS — Z79899 Other long term (current) drug therapy: Secondary | ICD-10-CM | POA: Diagnosis not present

## 2014-02-18 DIAGNOSIS — Z8739 Personal history of other diseases of the musculoskeletal system and connective tissue: Secondary | ICD-10-CM | POA: Diagnosis not present

## 2014-02-18 DIAGNOSIS — Z87828 Personal history of other (healed) physical injury and trauma: Secondary | ICD-10-CM | POA: Diagnosis not present

## 2014-02-18 DIAGNOSIS — Z9889 Other specified postprocedural states: Secondary | ICD-10-CM | POA: Diagnosis not present

## 2014-02-18 DIAGNOSIS — Z8742 Personal history of other diseases of the female genital tract: Secondary | ICD-10-CM | POA: Insufficient documentation

## 2014-02-18 DIAGNOSIS — Z9851 Tubal ligation status: Secondary | ICD-10-CM | POA: Diagnosis not present

## 2014-02-18 DIAGNOSIS — K219 Gastro-esophageal reflux disease without esophagitis: Secondary | ICD-10-CM | POA: Insufficient documentation

## 2014-02-18 DIAGNOSIS — R1012 Left upper quadrant pain: Secondary | ICD-10-CM

## 2014-02-18 DIAGNOSIS — R109 Unspecified abdominal pain: Secondary | ICD-10-CM | POA: Diagnosis present

## 2014-02-18 DIAGNOSIS — Z3202 Encounter for pregnancy test, result negative: Secondary | ICD-10-CM | POA: Insufficient documentation

## 2014-02-18 DIAGNOSIS — Z8619 Personal history of other infectious and parasitic diseases: Secondary | ICD-10-CM | POA: Insufficient documentation

## 2014-02-18 LAB — COMPREHENSIVE METABOLIC PANEL
ALT: 12 U/L (ref 0–35)
ANION GAP: 20 — AB (ref 5–15)
AST: 16 U/L (ref 0–37)
Albumin: 4.2 g/dL (ref 3.5–5.2)
Alkaline Phosphatase: 45 U/L (ref 39–117)
BUN: 7 mg/dL (ref 6–23)
CALCIUM: 9.3 mg/dL (ref 8.4–10.5)
CO2: 21 meq/L (ref 19–32)
Chloride: 97 mEq/L (ref 96–112)
Creatinine, Ser: 0.86 mg/dL (ref 0.50–1.10)
GFR calc Af Amer: >90 mL/min (ref >90)
GFR, EST NON AFRICAN AMERICAN: 80 mL/min — AB (ref >90)
GLUCOSE: 65 mg/dL — AB (ref 70–99)
Potassium: 4.1 mEq/L (ref 3.7–5.3)
Sodium: 138 mEq/L (ref 137–147)
TOTAL PROTEIN: 7.6 g/dL (ref 6.0–8.3)
Total Bilirubin: 0.6 mg/dL (ref 0.3–1.2)

## 2014-02-18 LAB — CBC WITH DIFFERENTIAL/PLATELET
Basophils Absolute: 0 10*3/uL (ref 0.0–0.1)
Basophils Relative: 0 % (ref 0–1)
EOS ABS: 0.1 10*3/uL (ref 0.0–0.7)
EOS PCT: 1 % (ref 0–5)
HCT: 40 % (ref 36.0–46.0)
HEMOGLOBIN: 14.5 g/dL (ref 12.0–15.0)
LYMPHS ABS: 2.4 10*3/uL (ref 0.7–4.0)
Lymphocytes Relative: 34 % (ref 12–46)
MCH: 30 pg (ref 26.0–34.0)
MCHC: 36.3 g/dL — ABNORMAL HIGH (ref 30.0–36.0)
MCV: 82.8 fL (ref 78.0–100.0)
Monocytes Absolute: 0.6 10*3/uL (ref 0.1–1.0)
Monocytes Relative: 9 % (ref 3–12)
Neutro Abs: 4.1 10*3/uL (ref 1.7–7.7)
Neutrophils Relative %: 56 % (ref 43–77)
Platelets: 248 10*3/uL (ref 150–400)
RBC: 4.83 MIL/uL (ref 3.87–5.11)
RDW: 12.9 % (ref 11.5–15.5)
WBC: 7.2 10*3/uL (ref 4.0–10.5)

## 2014-02-18 LAB — URINALYSIS, ROUTINE W REFLEX MICROSCOPIC
Bilirubin Urine: NEGATIVE
Glucose, UA: NEGATIVE mg/dL
Ketones, ur: 40 mg/dL — AB
Nitrite: NEGATIVE
Protein, ur: NEGATIVE mg/dL
Specific Gravity, Urine: 1.003 — ABNORMAL LOW (ref 1.005–1.030)
UROBILINOGEN UA: 0.2 mg/dL (ref 0.0–1.0)
pH: 5.5 (ref 5.0–8.0)

## 2014-02-18 LAB — PREGNANCY, URINE: Preg Test, Ur: NEGATIVE

## 2014-02-18 LAB — URINE MICROSCOPIC-ADD ON

## 2014-02-18 LAB — LIPASE, BLOOD: Lipase: 167 U/L — ABNORMAL HIGH (ref 11–59)

## 2014-02-18 MED ORDER — HYDROCODONE-ACETAMINOPHEN 5-325 MG PO TABS: 1.0000 | ORAL_TABLET | ORAL | Status: DC | PRN

## 2014-02-18 MED ORDER — IOHEXOL 300 MG/ML  SOLN
25.0000 mL | Freq: Once | INTRAMUSCULAR | Status: AC | PRN
  Administered 2014-02-18: 25 mL via ORAL

## 2014-02-18 MED ORDER — CIPROFLOXACIN HCL 500 MG PO TABS: 500.0000 mg | ORAL_TABLET | Freq: Two times a day (BID) | ORAL | Status: DC

## 2014-02-18 MED ORDER — ONDANSETRON HCL 4 MG/2ML IJ SOLN
4.0000 mg | Freq: Once | INTRAMUSCULAR | Status: DC
  Filled 2014-02-18: qty 2

## 2014-02-18 MED ORDER — IOHEXOL 300 MG/ML  SOLN
80.0000 mL | Freq: Once | INTRAMUSCULAR | Status: AC | PRN
  Administered 2014-02-18: 80 mL via INTRAVENOUS

## 2014-02-18 MED ORDER — MORPHINE SULFATE 4 MG/ML IJ SOLN
4.0000 mg | INTRAMUSCULAR | Status: DC | PRN
  Filled 2014-02-18: qty 1

## 2014-02-18 MED ORDER — ONDANSETRON 4 MG PO TBDP: 4.0000 mg | ORAL_TABLET | Freq: Three times a day (TID) | ORAL | Status: DC | PRN

## 2014-02-18 MED ORDER — DEXTROSE 5 % IV SOLN
1.0000 g | Freq: Once | INTRAVENOUS | Status: AC
  Administered 2014-02-18: 1 g via INTRAVENOUS
  Filled 2014-02-18: qty 10

## 2014-02-18 NOTE — Discharge Instructions (Signed)
Your Urine appears infected, and your CT scan suggests a UTI. Your Pancreas enzyme is elevated slightly, but you Ct scan shows a normal Pancreas. Please recheck with your PCP this week to re-check your pancreas enzymes.  Abdominal Pain Many things can cause abdominal pain. Usually, abdominal pain is not caused by a disease and will improve without treatment. It can often be observed and treated at home. Your health care provider will do a physical exam and possibly order blood tests and X-rays to help determine the seriousness of your pain. However, in many cases, more time must pass before a clear cause of the pain can be found. Before that point, your health care provider may not know if you need more testing or further treatment. HOME CARE INSTRUCTIONS  Monitor your abdominal pain for any changes. The following actions may help to alleviate any discomfort you are experiencing:  Only take over-the-counter or prescription medicines as directed by your health care provider.  Do not take laxatives unless directed to do so by your health care provider.  Try a clear liquid diet (broth, tea, or water) as directed by your health care provider. Slowly move to a bland diet as tolerated. SEEK MEDICAL CARE IF:  You have unexplained abdominal pain.  You have abdominal pain associated with nausea or diarrhea.  You have pain when you urinate or have a bowel movement.  You experience abdominal pain that wakes you in the night.  You have abdominal pain that is worsened or improved by eating food.  You have abdominal pain that is worsened with eating fatty foods.  You have a fever. SEEK IMMEDIATE MEDICAL CARE IF:   Your pain does not go away within 2 hours.  You keep throwing up (vomiting).  Your pain is felt only in portions of the abdomen, such as the right side or the left lower portion of the abdomen.  You pass bloody or black tarry stools. MAKE SURE YOU:  Understand these instructions.    Will watch your condition.   Will get help right away if you are not doing well or get worse.  Document Released: 02/24/2005 Document Revised: 05/22/2013 Document Reviewed: 01/24/2013 [facility] Patient Information 2015 [place], [place]. This information is not intended to replace advice given to you by your health care provider. Make sure you discuss any questions you have with your health care provider.  Pyelonephritis, Adult Pyelonephritis is a kidney infection. A kidney infection can happen quickly, or it can last for a long time. HOME CARE   Take your medicine (antibiotics) as told. Finish it even if you start to feel better.  Keep all doctor visits as told.  Drink enough fluids to keep your pee (urine) clear or pale yellow.  Only take medicine as told by your doctor. GET HELP RIGHT AWAY IF:   You have a fever or lasting symptoms for more than 2-3 days.  You have a fever and your symptoms suddenly get worse.  You cannot take your medicine or drink fluids as told.  You have chills and shaking.  You feel very weak or pass out (faint).  You do not feel better after 2 days. MAKE SURE YOU:  Understand these instructions.  Will watch your condition.  Will get help right away if you are not doing well or get worse. Document Released: 06/24/2004 Document Revised: 11/16/2011 Document Reviewed: 11/04/2010 [facility] Patient Information 2015 [place], [place]. This information is not intended to replace advice given to you by your health care  provider. Make sure you discuss any questions you have with your health care provider. ° °

## 2014-02-18 NOTE — ED Notes (Signed)
Pt reports LUQ pain since 9/16, seen here for the same. States she followed up with GI, had normal endoscopy performed Friday. Pt states "it now hurts to take a deep breath." Reports nausea, but denies emesis. LMB Friday. Reports increase in gas and belching. Pt in NAD. AO x4.

## 2014-02-18 NOTE — ED Notes (Signed)
Patient transported to CT 

## 2014-02-19 NOTE — ED Provider Notes (Signed)
CSN: [id number]     Arrival date & time 02/18/14  1452 History   First MD Initiated Contact with Patient 02/18/14 1901     Chief Complaint  Patient presents with  . Abdominal Pain      HPI  Presents with abdominal pain. Symptoms for several weeks. Seen and evaluated here the 16th. An endoscopy the next day was normal. Is here describing symptoms of an acid feeling in her throat. Had normal lab, ultrasound. Had a lipase of 102. Had an EGD the next day was told this was normal, "other than a small thing in my stomach".  Continues on proton pump inhibitor and Carafate. Denies alcohol. No history of high triglycerides. Not pregnant.  Past Medical History  Diagnosis Date  . History of blood transfusion   . GERD (gastroesophageal reflux disease)   . H/O menorrhagia   . Migraines   . HPV in female   . Abnormal pap   . GC (gonococcus infection)   . Ectopic fetus   . Ovarian cyst   . Bursitis     RIGHT HIP AND SHOULDER  . Broken toe    Past Surgical History  Procedure Laterality Date  . Laparoscopy      D AND C  . Tubal ligation    . Wisdom tooth extraction    . Novasure ablation     Family History  Problem Relation Age of Onset  . Hyperlipidemia Mother   . Asthma Mother   . Diabetes Father   . Hypertension Father   . Sickle cell trait Daughter   . Celiac disease Son    History  Substance Use Topics  . Smoking status: Never Smoker   . Smokeless tobacco: Never Used  . Alcohol Use: Yes     Comment: social   OB History   Grav Para Term Preterm Abortions TAB SAB Ect Mult Living   Review of Systems  Constitutional: Negative for fever, chills, diaphoresis, appetite change and fatigue.  HENT: Negative for mouth sores, sore throat and trouble swallowing.   Eyes: Negative for visual disturbance.  Respiratory: Negative for cough, chest tightness, shortness of breath and wheezing.   Cardiovascular: Negative for chest pain.  Gastrointestinal: Positive for  nausea and abdominal pain. Negative for vomiting, diarrhea and abdominal distention.       Upper abdomen esophageal "burning". Left flank pain.  Endocrine: Negative for polydipsia, polyphagia and polyuria.  Genitourinary: Negative for dysuria, frequency and hematuria.  Musculoskeletal: Negative for gait problem.  Skin: Negative for color change, pallor and rash.  Neurological: Negative for dizziness, syncope, light-headedness and headaches.  Hematological: Does not bruise/bleed easily.  Psychiatric/Behavioral: Negative for behavioral problems and confusion.      Allergies  Biaxin  Home Medications   Prior to Admission medications   Medication Sig Start Date End Date Taking? Authorizing Provider  ALPRAZolam Prudy Feeler) 0.5 MG tablet Take 0.5 mg by mouth at bedtime as needed for anxiety or sleep.   Yes Historical Provider, MD  esomeprazole (NEXIUM) 40 MG capsule Take 40 mg by mouth daily at 12 noon.   Yes Historical Provider, MD  FLUoxetine (PROZAC) 10 MG capsule Take 10 mg by mouth daily.   Yes Historical Provider, MD  Multiple Vitamin (MULTIVITAMIN WITH MINERALS) TABS tablet Take 1 tablet by mouth daily.   Yes Historical Provider, MD  sucralfate (CARAFATE) 1 G tablet Take 1 g by mouth 4 (four) times  daily.   Yes Historical Provider, MD  ciprofloxacin (CIPRO) 500 MG tablet Take 1 tablet (500 mg total) by mouth every 12 (twelve) hours. 02/18/14   Rolland Porter, MD  HYDROcodone-acetaminophen (NORCO/VICODIN) 5-325 MG per tablet Take 1 tablet by mouth every 4 (four) hours as needed. 02/18/14   Rolland Porter, MD  ondansetron (ZOFRAN ODT) 4 MG disintegrating tablet Take 1 tablet (4 mg total) by mouth every 8 (eight) hours as needed for nausea. 02/18/14   Rolland Porter, MD   BP 112/72  Pulse 90  Temp(Src) 98.2 F (36.8 C) (Oral)  Resp 15  Ht  (1.626 m)  Wt 170 lb (77.111 kg)  BMI 29.17 kg/m2  SpO2 100%  LMP 02/05/2014 Physical Exam  Constitutional: She is oriented to person, place, and time. She  appears well-developed and well-nourished. No distress.  HENT:  Head: Normocephalic.  Eyes: Conjunctivae are normal. Pupils are equal, round, and reactive to light. No scleral icterus.  Neck: Normal range of motion. Neck supple. No thyromegaly present.  Cardiovascular: Normal rate and regular rhythm.  Exam reveals no gallop and no friction rub.   No murmur heard. Pulmonary/Chest: Effort normal and breath sounds normal. No respiratory distress. She has no wheezes. She has no rales.  Abdominal: Soft. Bowel sounds are normal. She exhibits no distension. There is no tenderness. There is no rebound.    Musculoskeletal: Normal range of motion.       Back:  Neurological: She is alert and oriented to person, place, and time.  Skin: Skin is warm and dry. No rash noted.  Psychiatric: She has a normal mood and affect. Her behavior is normal.    ED Course  Procedures (including critical care time) Labs Review Labs Reviewed  CBC WITH DIFFERENTIAL - Abnormal; Notable for the following:    MCHC 36.3 (*)    All other components within normal limits  COMPREHENSIVE METABOLIC PANEL - Abnormal; Notable for the following:    Glucose, Bld 65 (*)    GFR calc non Af Amer 80 (*)    Anion gap 20 (*)    All other components within normal limits  LIPASE, BLOOD - Abnormal; Notable for the following:    Lipase 167 (*)    All other components within normal limits  URINALYSIS, ROUTINE W REFLEX MICROSCOPIC - Abnormal; Notable for the following:    APPearance CLOUDY (*)    Specific Gravity, Urine 1.003 (*)    Hgb urine dipstick TRACE (*)    Ketones, ur 40 (*)    Leukocytes, UA LARGE (*)    All other components within normal limits  URINE MICROSCOPIC-ADD ON - Abnormal; Notable for the following:    Squamous Epithelial / LPF MANY (*)    Bacteria, UA FEW (*)    All other components within normal limits  PREGNANCY, URINE  I-STAT TROPOININ, ED    Imaging Review Ct Abdomen Pelvis W Contrast  02/18/2014    CLINICAL DATA:  Upper left abdominal pain  EXAM: CT ABDOMEN AND PELVIS WITH CONTRAST  TECHNIQUE: Multidetector CT imaging of the abdomen and pelvis was performed using the standard protocol following bolus administration of intravenous contrast.  CONTRAST:  80mL OMNIPAQUE IOHEXOL 300 MG/ML  SOLN  COMPARISON:  Prior ultrasound from 02/13/2014  FINDINGS: The visualized lung bases are clear.  The liver demonstrates a normal contrast enhanced appearance. Probable fat deposition noted adjacent to the fissure for ligamentum teres. Gallbladder within normal limits. No biliary dilatation. The spleen, adrenal glands, and pancreas  demonstrate a normal contrast enhanced appearance.  Kidneys are equal in size with symmetric enhancement. Small cysts noted within the right kidney. No nephrolithiasis, hydronephrosis, or focal enhancing renal mass.  Stomach within normal limits. No evidence of bowel obstruction. No abnormal wall thickening, mucosal enhancement, or inflammatory changes seen about the bowels. Appendix is normal.  Circumferential bladder wall thickening is present, which may be related to incomplete distension or possibly cystitis.  Uterus is enlarged with probable fibroids present. Ovaries within normal limits.  No free air or fluid. No pathologically enlarged intra-abdominal pelvic lymph nodes. Normal intravascular enhancement seen throughout the abdomen and pelvis.  No acute osseous abnormality. No worrisome lytic or blastic osseous lesions.  IMPRESSION: 1. No CT evidence of acute intra-abdominal or pelvic process. 2. Circumferential bladder wall thickening, which may be related to incomplete distension or possibly acute cystitis. Correlation with urinalysis recommended. No evidence of upper urinary tract infection. 3. Fibroid uterus.   Electronically Signed   By: Rise Mu M.D.   On: 02/18/2014 22:30     EKG Interpretation None      MDM   Final diagnoses:  Abdominal pain, unspecified  abdominal location  Elevated lipase  Pyelonephritis    Vision CT shows an unremarkable upper abdomen. No acute abnormalities. Lipase is elevated at 167. O2 5 days ago. Uncertain significance of this. She has not have any abnormalities on CT. She has no risk for pancreatitis. Denies alcohol. No history of hypertriglyceridemia. Is not on any offending medications. No gallstones and normal ultrasound. Her urine appears infected. Her CT shows bladder thickening consistent with cystitis. Her tenderness in her abdomen is left lateral and left flank and this may all be simple pyelonephritis. Patient is appropriate for continued outpatient care. Given IV Rocephin. Discharged with pain medication and Levaquin. I have asked her to followup with her physician within the next 3-4 days to recheck lipase. Return here with any worsening symptoms.    Rolland Porter, MD 02/19/14 0030

## 2014-02-20 ENCOUNTER — Inpatient Hospital Stay: Admission: RE | Admit: 2014-02-20 | Payer: BC Managed Care – PPO | Source: Ambulatory Visit

## 2014-02-21 ENCOUNTER — Other Ambulatory Visit (HOSPITAL_COMMUNITY): Payer: Self-pay | Admitting: Gastroenterology

## 2014-02-21 ENCOUNTER — Other Ambulatory Visit: Payer: BC Managed Care – PPO

## 2014-02-21 DIAGNOSIS — R748 Abnormal levels of other serum enzymes: Secondary | ICD-10-CM

## 2014-02-21 DIAGNOSIS — R1012 Left upper quadrant pain: Secondary | ICD-10-CM

## 2014-03-01 ENCOUNTER — Ambulatory Visit (HOSPITAL_COMMUNITY): Payer: BC Managed Care – PPO

## 2014-04-01 ENCOUNTER — Encounter (HOSPITAL_COMMUNITY): Payer: Self-pay | Admitting: Emergency Medicine

## 2014-06-19 ENCOUNTER — Other Ambulatory Visit: Payer: Self-pay | Admitting: Gastroenterology

## 2014-06-19 DIAGNOSIS — R1011 Right upper quadrant pain: Secondary | ICD-10-CM

## 2014-07-15 ENCOUNTER — Encounter (HOSPITAL_COMMUNITY)
Admission: RE | Admit: 2014-07-15 | Discharge: 2014-07-15 | Disposition: A | Discharge status: Home / Self Care | Payer: BLUE CROSS/BLUE SHIELD | Source: Ambulatory Visit | Attending: Gastroenterology | Admitting: Gastroenterology

## 2014-07-15 ENCOUNTER — Ambulatory Visit (HOSPITAL_COMMUNITY)
Admission: RE | Admit: 2014-07-15 | Discharge: 2014-07-15 | Disposition: A | Discharge status: Home / Self Care | Payer: BLUE CROSS/BLUE SHIELD | Source: Ambulatory Visit | Attending: Gastroenterology | Admitting: Gastroenterology

## 2014-07-15 DIAGNOSIS — R1011 Right upper quadrant pain: Secondary | ICD-10-CM

## 2014-07-15 MED ORDER — SINCALIDE 5 MCG IJ SOLR
INTRAMUSCULAR | Status: AC
  Administered 2014-07-15: 5 ug via INTRAVENOUS
  Filled 2014-07-15: qty 5

## 2014-07-15 MED ORDER — TECHNETIUM TC 99M MEBROFENIN IV KIT
5.0000 | PACK | Freq: Once | INTRAVENOUS | Status: AC | PRN
  Administered 2014-07-15: 5 via INTRAVENOUS

## 2014-07-15 MED ORDER — SINCALIDE 5 MCG IJ SOLR
0.0200 ug/kg | Freq: Once | INTRAMUSCULAR | Status: AC
  Administered 2014-07-15: 5 ug via INTRAVENOUS

## 2014-07-18 ENCOUNTER — Ambulatory Visit (HOSPITAL_COMMUNITY)
Admission: RE | Admit: 2014-07-18 | Discharge: 2014-07-18 | Disposition: A | Discharge status: Home / Self Care | Payer: BLUE CROSS/BLUE SHIELD | Source: Ambulatory Visit | Attending: Gastroenterology | Admitting: Gastroenterology

## 2014-07-18 DIAGNOSIS — R1011 Right upper quadrant pain: Secondary | ICD-10-CM

## 2014-07-18 DIAGNOSIS — R6881 Early satiety: Secondary | ICD-10-CM | POA: Insufficient documentation

## 2014-07-18 DIAGNOSIS — R110 Nausea: Secondary | ICD-10-CM | POA: Diagnosis not present

## 2014-07-18 MED ORDER — TECHNETIUM TC 99M SULFUR COLLOID
2.0000 | Freq: Once | INTRAVENOUS | Status: AC | PRN
  Administered 2014-07-18: 2 via ORAL

## 2014-08-07 ENCOUNTER — Ambulatory Visit (INDEPENDENT_AMBULATORY_CARE_PROVIDER_SITE_OTHER): Payer: Self-pay | Admitting: General Surgery

## 2014-08-13 NOTE — Pre-Procedure Instructions (Signed)
Julia Shelton  08/13/2014   Your procedure is scheduled on:  Tues, Mar 22 @ 7:30 AM  [facility]  at 5:30 AM.  Call this number if you have problems the morning of surgery: [phone]   Remember:   Do not eat food or drink liquids after midnight.   Take these medicines the morning of surgery with A SIP OF WATER: Alprazolam(Xanax),Cipro(Ciprofloxacin),Prozac(Fluoxetine),Pain Pill(if needed),and Zofran(Ondansetron-if needed)               Stop taking your Vitamins. No Goody's,BC's,Aleve,Aspirin,Ibuprofen,Fish Oil,or any Herbal Medications.   Do not wear jewelry, make-up or nail polish.  Do not wear lotions, powders, or perfumes. You may wear deodorant.  Do not shave 48 hours prior to surgery.   Do not bring valuables to the hospital.  [facility] is not responsible                  for any belongings or valuables.               Contacts, dentures or bridgework may not be worn into surgery.  Leave suitcase in the car. After surgery it may be brought to your room.  For patients admitted to the hospital, discharge time is determined by your                treatment team.               Patients discharged the day of surgery will not be allowed to drive  home.    Special Instructions:  [place] - Preparing for Surgery  Before surgery, you can play an important role.  Because skin is not sterile, your skin needs to be as free of germs as possible.  You can reduce the number of germs on you skin by washing with CHG (chlorahexidine gluconate) soap before surgery.  CHG is an antiseptic cleaner which kills germs and bonds with the skin to continue killing germs even after washing.  Please DO NOT use if you have an allergy to CHG or antibacterial soaps.  If your skin becomes reddened/irritated stop using the CHG and inform your nurse when you arrive at Short Stay.  Do not shave (including legs and underarms) for at least 48 hours prior to the first CHG shower.   You may shave your face.  Please follow these instructions carefully:   1.  Shower with CHG Soap the night before surgery and the                                morning of Surgery.  2.  If you choose to wash your hair, wash your hair first as usual with your       normal shampoo.  3.  After you shampoo, rinse your hair and body thoroughly to remove the                      Shampoo.  4.  Use CHG as you would any other liquid soap.  You can apply chg directly       to the skin and wash gently with scrungie or a clean washcloth.  5.  Apply the CHG Soap to your body ONLY FROM THE NECK DOWN.        Do not use on open wounds or open sores.  Avoid contact with your eyes,  ears, mouth and genitals (private parts).  Wash genitals (private parts)       with your normal soap.  6.  Wash thoroughly, paying special attention to the area where your surgery        will be performed.  7.  Thoroughly rinse your body with warm water from the neck down.  8.  DO NOT shower/wash with your normal soap after using and rinsing off       the CHG Soap.  9.  Pat yourself dry with a clean towel.            10.  Wear clean pajamas.            11.  Place clean sheets on your bed the night of your first shower and do not        sleep with pets.  Day of Surgery  Do not apply any lotions/deoderants the morning of surgery.  Please wear clean clothes to the hospital/surgery center.     Please read over the following fact sheets that you were given: Pain Booklet, Coughing and Deep Breathing and Surgical Site Infection Prevention

## 2014-08-14 ENCOUNTER — Encounter (HOSPITAL_COMMUNITY): Payer: Self-pay

## 2014-08-14 ENCOUNTER — Encounter (HOSPITAL_COMMUNITY)
Admission: RE | Admit: 2014-08-14 | Discharge: 2014-08-14 | Disposition: A | Discharge status: Home / Self Care | Payer: BLUE CROSS/BLUE SHIELD | Source: Ambulatory Visit | Attending: General Surgery | Admitting: General Surgery

## 2014-08-14 DIAGNOSIS — Z01812 Encounter for preprocedural laboratory examination: Secondary | ICD-10-CM | POA: Diagnosis present

## 2014-08-14 HISTORY — DX: Anxiety disorder, unspecified: F41.9

## 2014-08-14 HISTORY — DX: Personal history of other diseases of the nervous system and sense organs: Z86.69

## 2014-08-14 HISTORY — DX: Dermatitis, unspecified: L30.9

## 2014-08-14 HISTORY — DX: Sciatica, unspecified side: M54.30

## 2014-08-14 HISTORY — DX: Nausea with vomiting, unspecified: R11.2

## 2014-08-14 LAB — HCG, SERUM, QUALITATIVE: Preg, Serum: NEGATIVE

## 2014-08-14 MED ORDER — CHLORHEXIDINE GLUCONATE 4 % EX LIQD: 1.0000 "application " | Freq: Once | CUTANEOUS | Status: DC

## 2014-08-14 NOTE — Progress Notes (Addendum)
Medical Md is Dr.Candace Smith  EKG in epic from 02-18-14  Pt doesn't have a cardiologist  Denies ever having an echo/stress test/heart cath

## 2014-08-19 MED ORDER — CEFAZOLIN SODIUM-DEXTROSE 2-3 GM-% IV SOLR
2.0000 g | INTRAVENOUS | Status: AC
  Administered 2014-08-20: 2 g via INTRAVENOUS
  Filled 2014-08-19: qty 50

## 2014-08-20 ENCOUNTER — Ambulatory Visit (HOSPITAL_COMMUNITY)
Admission: RE | Admit: 2014-08-20 | Discharge: 2014-08-20 | Disposition: A | Discharge status: Home / Self Care | Payer: BLUE CROSS/BLUE SHIELD | Source: Ambulatory Visit | Attending: General Surgery | Admitting: General Surgery

## 2014-08-20 ENCOUNTER — Ambulatory Visit (HOSPITAL_COMMUNITY): Payer: BLUE CROSS/BLUE SHIELD | Admitting: Anesthesiology

## 2014-08-20 ENCOUNTER — Encounter (HOSPITAL_COMMUNITY)
Admission: RE | Disposition: A | Discharge status: Home / Self Care | Payer: Self-pay | Source: Ambulatory Visit | Attending: General Surgery

## 2014-08-20 ENCOUNTER — Encounter (HOSPITAL_COMMUNITY): Payer: Self-pay | Admitting: *Deleted

## 2014-08-20 DIAGNOSIS — F419 Anxiety disorder, unspecified: Secondary | ICD-10-CM | POA: Insufficient documentation

## 2014-08-20 DIAGNOSIS — K811 Chronic cholecystitis: Secondary | ICD-10-CM | POA: Diagnosis not present

## 2014-08-20 DIAGNOSIS — K828 Other specified diseases of gallbladder: Secondary | ICD-10-CM | POA: Insufficient documentation

## 2014-08-20 DIAGNOSIS — K219 Gastro-esophageal reflux disease without esophagitis: Secondary | ICD-10-CM | POA: Insufficient documentation

## 2014-08-20 DIAGNOSIS — Z87891 Personal history of nicotine dependence: Secondary | ICD-10-CM | POA: Insufficient documentation

## 2014-08-20 DIAGNOSIS — G43909 Migraine, unspecified, not intractable, without status migrainosus: Secondary | ICD-10-CM | POA: Diagnosis not present

## 2014-08-20 HISTORY — PX: CHOLECYSTECTOMY: SHX55

## 2014-08-20 SURGERY — LAPAROSCOPIC CHOLECYSTECTOMY
Anesthesia: General | Site: Abdomen

## 2014-08-20 MED ORDER — FENTANYL CITRATE 0.05 MG/ML IJ SOLN
INTRAMUSCULAR | Status: DC | PRN
  Administered 2014-08-20 (×4): 50 ug via INTRAVENOUS

## 2014-08-20 MED ORDER — GLYCOPYRROLATE 0.2 MG/ML IJ SOLN
INTRAMUSCULAR | Status: DC | PRN
  Administered 2014-08-20: .4 mg via INTRAVENOUS

## 2014-08-20 MED ORDER — OXYCODONE HCL 5 MG PO TABS: 5.0000 mg | ORAL_TABLET | ORAL | Status: DC | PRN

## 2014-08-20 MED ORDER — HYDROMORPHONE HCL 1 MG/ML IJ SOLN
INTRAMUSCULAR | Status: AC
  Filled 2014-08-20: qty 1

## 2014-08-20 MED ORDER — ONDANSETRON HCL 4 MG/2ML IJ SOLN
INTRAMUSCULAR | Status: DC | PRN
  Administered 2014-08-20: 4 mg via INTRAVENOUS

## 2014-08-20 MED ORDER — BUPIVACAINE HCL (PF) 0.25 % IJ SOLN
INTRAMUSCULAR | Status: AC
  Filled 2014-08-20: qty 30

## 2014-08-20 MED ORDER — PROPOFOL 10 MG/ML IV BOLUS
INTRAVENOUS | Status: DC | PRN
  Administered 2014-08-20: 150 mg via INTRAVENOUS

## 2014-08-20 MED ORDER — PHENYLEPHRINE HCL 10 MG/ML IJ SOLN
INTRAMUSCULAR | Status: DC | PRN
  Administered 2014-08-20 (×2): 80 ug via INTRAVENOUS

## 2014-08-20 MED ORDER — BUPIVACAINE HCL 0.25 % IJ SOLN
INTRAMUSCULAR | Status: DC | PRN
  Administered 2014-08-20: 30 mL

## 2014-08-20 MED ORDER — LIDOCAINE HCL (CARDIAC) 20 MG/ML IV SOLN
INTRAVENOUS | Status: DC | PRN
  Administered 2014-08-20: 100 mg via INTRAVENOUS

## 2014-08-20 MED ORDER — MIDAZOLAM HCL 2 MG/2ML IJ SOLN
INTRAMUSCULAR | Status: AC
  Filled 2014-08-20: qty 2

## 2014-08-20 MED ORDER — DEXAMETHASONE SODIUM PHOSPHATE 10 MG/ML IJ SOLN
INTRAMUSCULAR | Status: DC | PRN
  Administered 2014-08-20: 10 mg via INTRAVENOUS

## 2014-08-20 MED ORDER — SODIUM CHLORIDE 0.9 % IR SOLN
Status: DC | PRN
  Administered 2014-08-20: 1000 mL

## 2014-08-20 MED ORDER — LACTATED RINGERS IV SOLN
INTRAVENOUS | Status: DC | PRN
  Administered 2014-08-20: 07:00:00 via INTRAVENOUS

## 2014-08-20 MED ORDER — SUCCINYLCHOLINE CHLORIDE 20 MG/ML IJ SOLN
INTRAMUSCULAR | Status: DC | PRN
  Administered 2014-08-20: 100 mg via INTRAVENOUS

## 2014-08-20 MED ORDER — OXYCODONE-ACETAMINOPHEN 5-325 MG PO TABS: 1.0000 | ORAL_TABLET | ORAL | Status: AC | PRN

## 2014-08-20 MED ORDER — LIDOCAINE HCL (CARDIAC) 20 MG/ML IV SOLN
INTRAVENOUS | Status: AC
  Filled 2014-08-20: qty 5

## 2014-08-20 MED ORDER — ROCURONIUM BROMIDE 50 MG/5ML IV SOLN
INTRAVENOUS | Status: AC
  Filled 2014-08-20: qty 1

## 2014-08-20 MED ORDER — PROMETHAZINE HCL 25 MG/ML IJ SOLN: 6.2500 mg | INTRAMUSCULAR | Status: DC | PRN

## 2014-08-20 MED ORDER — MIDAZOLAM HCL 5 MG/5ML IJ SOLN
INTRAMUSCULAR | Status: DC | PRN
  Administered 2014-08-20: 2 mg via INTRAVENOUS

## 2014-08-20 MED ORDER — 0.9 % SODIUM CHLORIDE (POUR BTL) OPTIME
TOPICAL | Status: DC | PRN
  Administered 2014-08-20 (×2): 1000 mL

## 2014-08-20 MED ORDER — HYDROMORPHONE HCL 1 MG/ML IJ SOLN
0.2500 mg | INTRAMUSCULAR | Status: DC | PRN
  Administered 2014-08-20 (×2): 0.25 mg via INTRAVENOUS
  Administered 2014-08-20: 0.5 mg via INTRAVENOUS

## 2014-08-20 MED ORDER — NEOSTIGMINE METHYLSULFATE 10 MG/10ML IV SOLN
INTRAVENOUS | Status: DC | PRN
  Administered 2014-08-20: 3 mg via INTRAVENOUS

## 2014-08-20 MED ORDER — ROCURONIUM BROMIDE 100 MG/10ML IV SOLN
INTRAVENOUS | Status: DC | PRN
  Administered 2014-08-20: 30 mg via INTRAVENOUS

## 2014-08-20 MED ORDER — PROPOFOL 10 MG/ML IV BOLUS
INTRAVENOUS | Status: AC
  Filled 2014-08-20: qty 20

## 2014-08-20 MED ORDER — FENTANYL CITRATE 0.05 MG/ML IJ SOLN
INTRAMUSCULAR | Status: AC
  Filled 2014-08-20: qty 5

## 2014-08-20 SURGICAL SUPPLY — 48 items
APL SKNCLS STERI-STRIP NONHPOA (GAUZE/BANDAGES/DRESSINGS) ×1
APPLIER CLIP 5 13 M/L LIGAMAX5 (MISCELLANEOUS) ×2
APR CLP MED LRG 5 ANG JAW (MISCELLANEOUS) ×1
BAG SPEC RTRVL LRG 6X4 10 (ENDOMECHANICALS) ×1
BENZOIN TINCTURE PRP APPL 2/3 (GAUZE/BANDAGES/DRESSINGS) ×2 IMPLANT
CANISTER SUCTION 2500CC (MISCELLANEOUS) ×2 IMPLANT
CHLORAPREP W/TINT 26ML (MISCELLANEOUS) ×2 IMPLANT
CLIP APPLIE 5 13 M/L LIGAMAX5 (MISCELLANEOUS) IMPLANT
CLIP LIGATING HEMO O LOK GREEN (MISCELLANEOUS) ×2 IMPLANT
COVER SURGICAL LIGHT HANDLE (MISCELLANEOUS) ×2 IMPLANT
COVER TRANSDUCER ULTRASND (DRAPES) ×2 IMPLANT
DEVICE TROCAR PUNCTURE CLOSURE (ENDOMECHANICALS) ×3 IMPLANT
DRAPE LAPAROSCOPIC ABDOMINAL (DRAPES) ×2 IMPLANT
ELECT REM PT RETURN 9FT ADLT (ELECTROSURGICAL) ×2
ELECTRODE REM PT RTRN 9FT ADLT (ELECTROSURGICAL) ×1 IMPLANT
GAUZE SPONGE 2X2 8PLY STRL LF (GAUZE/BANDAGES/DRESSINGS) ×1 IMPLANT
GLOVE BIO SURGEON STRL SZ7.5 (GLOVE) ×2 IMPLANT
GLOVE BIOGEL PI IND STRL 7.0 (GLOVE) IMPLANT
GLOVE BIOGEL PI IND STRL 7.5 (GLOVE) IMPLANT
GLOVE BIOGEL PI INDICATOR 7.0 (GLOVE) ×2
GLOVE BIOGEL PI INDICATOR 7.5 (GLOVE) ×1
GLOVE SURG SS PI 7.0 STRL IVOR (GLOVE) ×2 IMPLANT
GLOVE SURG SS PI 7.5 STRL IVOR (GLOVE) ×2 IMPLANT
GOWN STRL REUS W/ TWL LRG LVL3 (GOWN DISPOSABLE) ×2 IMPLANT
GOWN STRL REUS W/ TWL XL LVL3 (GOWN DISPOSABLE) ×1 IMPLANT
GOWN STRL REUS W/TWL LRG LVL3 (GOWN DISPOSABLE) ×8
GOWN STRL REUS W/TWL XL LVL3 (GOWN DISPOSABLE) ×4
KIT BASIN OR (CUSTOM PROCEDURE TRAY) ×2 IMPLANT
KIT ROOM TURNOVER OR (KITS) ×2 IMPLANT
NDL INSUFFLATION 14GA 120MM (NEEDLE) ×1 IMPLANT
NEEDLE INSUFFLATION 14GA 120MM (NEEDLE) ×2 IMPLANT
NS IRRIG 1000ML POUR BTL (IV SOLUTION) ×2 IMPLANT
PAD ARMBOARD 7.5X6 YLW CONV (MISCELLANEOUS) ×4 IMPLANT
POUCH SPECIMEN RETRIEVAL 10MM (ENDOMECHANICALS) ×1 IMPLANT
SCISSORS LAP 5X35 DISP (ENDOMECHANICALS) ×2 IMPLANT
SET IRRIG TUBING LAPAROSCOPIC (IRRIGATION / IRRIGATOR) ×2 IMPLANT
SLEEVE ENDOPATH XCEL 5M (ENDOMECHANICALS) ×2 IMPLANT
SPECIMEN JAR SMALL (MISCELLANEOUS) ×2 IMPLANT
SPONGE GAUZE 2X2 STER 10/PKG (GAUZE/BANDAGES/DRESSINGS) ×1
STRIP CLOSURE SKIN 1/2X4 (GAUZE/BANDAGES/DRESSINGS) ×1 IMPLANT
SUT MNCRL AB 3-0 PS2 18 (SUTURE) ×3 IMPLANT
TAPE CLOTH SURG 4X10 WHT LF (GAUZE/BANDAGES/DRESSINGS) ×1 IMPLANT
TOWEL OR 17X24 6PK STRL BLUE (TOWEL DISPOSABLE) ×2 IMPLANT
TOWEL OR 17X26 10 PK STRL BLUE (TOWEL DISPOSABLE) ×2 IMPLANT
TRAY LAPAROSCOPIC (CUSTOM PROCEDURE TRAY) ×2 IMPLANT
TROCAR XCEL NON-BLD 11X100MML (ENDOMECHANICALS) ×2 IMPLANT
TROCAR XCEL NON-BLD 5MMX100MML (ENDOMECHANICALS) ×2 IMPLANT
TUBING INSUFFLATION (TUBING) ×2 IMPLANT

## 2014-08-20 NOTE — Progress Notes (Signed)
Discharge instructions gone over with patient and husband.  Both verbalized understanding of the instructions.  Pts husband signed the AVS.  PT is ready to be discharged home with husband

## 2014-08-20 NOTE — Transfer of Care (Signed)
Immediate Anesthesia Transfer of Care Note  Patient: Julia Shelton  Procedure(s) Performed: Procedure(s): LAPAROSCOPIC CHOLECYSTECTOMY (N/A)  Patient Location: PACU  Anesthesia Type:General  Level of Consciousness: awake, alert  and oriented  Airway & Oxygen Therapy: Patient Spontanous Breathing and Patient connected to nasal cannula oxygen  Post-op Assessment: Report given to RN and Post -op Vital signs reviewed and stable  Post vital signs: Reviewed and stable  Last Vitals:  Filed Vitals:   08/20/14 0548  BP: 106/74  Pulse: 78  Temp: 36.8 C  Resp: 20    Complications: No apparent anesthesia complications

## 2014-08-20 NOTE — H&P (Signed)
History of Present Illness Julia Shelton(Julia Lybbert Shelton; 07/30/2014 10:01 AM) Patient words: eval biliary.  The patient is a 49 year old female who presents with dyspepsia. Patient is a 49 year old female who is referred by Dr. Audley HoseHong for an evaluation of her gallbladder. Patient states that since August of last year she's had some episodes of continued regurgitation, epigastric abdominal pain as well as right upper quadrant abdominal pain. Patient has had a extensive workup to include a HIDA scan which was normal, a gastric emptying study which was normal, ultrasound which revealed no stones or cholecystitis, CT scan which was essentially normal. Patient does have symptoms suggestive of gallbladder disease to include abdominal pain after eating spicy, fatty, greasy foods. Patient does state that her abdominal pain has been getting worse as has her dyspepsia.   Other Problems Julia Shelton(Ashley Beck, CMA; 07/30/2014 9:49 AM) Anxiety Disorder Arthritis Gastroesophageal Reflux Disease Oophorectomy Right. Pancreatitis Transfusion history  Diagnostic Studies History Julia Shelton(Ashley [place], [place]; 07/30/2014 9:49 AM) Colonoscopy never Mammogram within last year Pap Smear 1-5 years ago  Allergies Julia Shelton(Ashley Beck, CMA; 07/30/2014 9:50 AM) Biaxin *MACROLIDES*  Medication History Julia Shelton(Ashley Beck, CMA; 07/30/2014 9:50 AM) Omeprazole (40MG  Capsule DR, Oral) Active. Sudafed PE Day & Night (25-10 & 10MG  Misc, Oral) Active. Medications Reconciled  Social History Julia Shelton(Ashley [place], [place]; 07/30/2014 9:49 AM) Alcohol use Occasional alcohol use. Caffeine use Carbonated beverages, Coffee. No drug use Tobacco use Former smoker.  Family History Julia Shelton(Ashley [place], [place]; 07/30/2014 9:49 AM) Arthritis Mother. Colon Polyps Father. Diabetes Mellitus Father. Family history unknown First Degree Relatives Hypertension Father, Sister. Migraine Headache Sister. Respiratory Condition Brother, Mother.  Pregnancy / Birth History Julia Shelton(Ashley Beck,  CMA; 07/30/2014 9:49 AM) Age at menarche 13 years. Contraceptive History Oral contraceptives. Gravida 5 Maternal age 48-20 Para 3 Regular periods  Review of Systems Julia Shelton(Julia Shelton; 07/30/2014 9:59 AM) General Present- Appetite Loss, Fatigue and Night Sweats. Not Present- Chills, Fever, Weight Gain and Weight Loss. Skin Not Present- Change in Wart/Mole, Dryness, Hives, Jaundice, New Lesions, Non-Healing Wounds, Rash and Ulcer. HEENT Present- Hoarseness, Ringing in the Ears, Seasonal Allergies and Wears glasses/contact lenses. Not Present- Earache, Hearing Loss, Nose Bleed, Oral Ulcers, Sinus Pain, Sore Throat, Visual Disturbances and Yellow Eyes. Respiratory Present- Snoring. Not Present- Bloody sputum, Chronic Cough, Difficulty Breathing and Wheezing. Breast Not Present- Breast Mass, Breast Pain, Nipple Discharge and Skin Changes. Cardiovascular Not Present- Chest Pain, Difficulty Breathing Lying Down, Leg Cramps, Palpitations, Rapid Heart Rate, Shortness of Breath and Swelling of Extremities. Gastrointestinal Present- Abdominal Pain, Bloating, Change in Bowel Habits, Constipation, Difficulty Swallowing, Excessive gas, Gets full quickly at meals, Indigestion and Nausea. Not Present- Bloody Stool, Chronic diarrhea, Hemorrhoids, Rectal Pain and Vomiting. Female Genitourinary Not Present- Frequency, Nocturia, Painful Urination, Pelvic Pain and Urgency. Musculoskeletal Present- Back Pain. Not Present- Joint Pain, Joint Stiffness, Muscle Pain, Muscle Weakness and Swelling of Extremities. Neurological Present- Headaches. Not Present- Decreased Memory, Fainting, Numbness, Seizures, Tingling, Tremor, Trouble walking and Weakness. Psychiatric Present- Anxiety. Not Present- Bipolar, Change in Sleep Pattern, Depression, Fearful and Frequent crying. Endocrine Present- Hot flashes. Not Present- Cold Intolerance, Excessive Hunger, Hair Changes, Heat Intolerance and New Diabetes. Hematology Not Present-  Easy Bruising, Excessive bleeding, Gland problems, HIV and Persistent Infections.   Vitals Julia Shelton(Ashley Beck CMA; 07/30/2014 9:51 AM) 07/30/2014 9:51 AM Weight: 146 lb Height: 64in Body Surface Area: 1.73 m Body Mass Index: 25.06 kg/m Temp.: 96.66F(Temporal)  Pulse: 77 (Regular)  BP: 128/62 (Sitting, Left Arm, Standard)    Physical Exam Julia Shelton(Rebecca Motta Shelton;  07/30/2014 9:59 AM) General Mental Status-Alert. General Appearance-Consistent with stated age. Hydration-Well hydrated. Voice-Normal.  Head and Neck Head-normocephalic, atraumatic with no lesions or palpable masses.  Chest and Lung Exam Chest and lung exam reveals -quiet, even and easy respiratory effort with no use of accessory muscles and on auscultation, normal breath sounds, no adventitious sounds and normal vocal resonance. Inspection Chest Wall - Normal. Back - normal.  Cardiovascular Cardiovascular examination reveals -on palpation PMI is normal in location and amplitude, no palpable S3 or S4. Normal cardiac borders., normal heart sounds, regular rate and rhythm with no murmurs, carotid auscultation reveals no bruits and normal pedal pulses bilaterally.  Abdomen Inspection Normal Exam - No Hernias. Skin - Scar - no surgical scars. Palpation/Percussion Normal exam - Soft, Non Tender, No Rebound tenderness, No Rigidity (guarding) and No hepatosplenomegaly. Auscultation Normal exam - Bowel sounds normal.  Neurologic Neurologic evaluation reveals -alert and oriented x 3 with no impairment of recent or remote memory. Mental Status-Normal.  Musculoskeletal Normal Exam - Left-Upper Extremity Strength Normal and Lower Extremity Strength Normal. Normal Exam - Right-Upper Extremity Strength Normal, Lower Extremity Weakness.    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 I had a long  discussion with the patient and her husband regarding the fact that her workup has been normal to date. Based on the patient's symptomatology I do believe there is a component of gallbladder disease. I did discuss with the patient that his difficult to assure that removing her gallbladder will return symptoms. At this point the patient is willing to try having gallbladder removed to see if this helps with symptoms.  1. Will proceed to the operative for laparoscopic cholecystectomy 2. Risks and benefits were discussed with the patient to generally include, but not limited to: infection, bleeding, possible need for post op ERCP, damage to the bile ducts, bile leak, and possible need for further surgery. Alternatives were offered and described. All questions were answered and the patient voiced understanding of the procedure and wishes to proceed at this point with a laparoscopic cholecystectomy

## 2014-08-20 NOTE — Discharge Instructions (Signed)
[facility] ______[facility] °LAPAROSCOPIC SURGERY: POST OP INSTRUCTIONS °Always review your discharge instruction sheet given to you by the facility where your surgery was performed. °IF YOU HAVE DISABILITY OR FAMILY LEAVE FORMS, YOU MUST BRING THEM TO THE OFFICE FOR PROCESSING.   °DO NOT GIVE THEM TO YOUR DOCTOR. ° °1. A prescription for pain medication may be given to you upon discharge.  Take your pain medication as prescribed, if needed.  If narcotic pain medicine is not needed, then you may take acetaminophen (Tylenol) or ibuprofen (Advil) as needed. °2. Take your usually prescribed medications unless otherwise directed. °3. If you need a refill on your pain medication, please contact your pharmacy.  They will contact our office to request authorization. Prescriptions will not be filled after 5pm or on week-ends. °4. You should follow a light diet the first few days after arrival home, such as soup and crackers, etc.  Be sure to include lots of fluids daily. °5. Most patients will experience some swelling and bruising in the area of the incisions.  Ice packs will help.  Swelling and bruising can take several days to resolve.  °6. It is common to experience some constipation if taking pain medication after surgery.  Increasing fluid intake and taking a stool softener (such as Colace) will usually help or prevent this problem from occurring.  A mild laxative (Milk of Magnesia or Miralax) should be taken according to package instructions if there are no bowel movements after 48 hours. °7. Unless discharge instructions indicate otherwise, you may remove your bandages 24-48 hours after surgery, and you may shower at that time.  You may have steri-strips (small skin tapes) in place directly over the incision.  These strips should be left on the skin for 7-10 days.  If your surgeon used skin glue on the incision, you may shower in 24 hours.  The glue will flake off over the next 2-3 weeks.  Any sutures or  staples will be removed at the office during your follow-up visit. °8. ACTIVITIES:  You may resume regular (light) daily activities beginning the next day--such as daily self-care, walking, climbing stairs--gradually increasing activities as tolerated.  You may have sexual intercourse when it is comfortable.  Refrain from any heavy lifting or straining until approved by your doctor. °a. You may drive when you are no longer taking prescription pain medication, you can comfortably wear a seatbelt, and you can safely maneuver your car and apply brakes. °b. RETURN TO WORK:  __________________________________________________________ °9. You should see your doctor in the office for a follow-up appointment approximately 2-3 weeks after your surgery.  Make sure that you call for this appointment within a day or two after you arrive home to insure a convenient appointment time. °10. OTHER INSTRUCTIONS: __________________________________________________________________________________________________________________________ __________________________________________________________________________________________________________________________ °WHEN TO CALL YOUR DOCTOR: °1. Fever over 101.0 °2. Inability to urinate °3. Continued bleeding from incision. °4. Increased pain, redness, or drainage from the incision. °5. Increasing abdominal pain ° °The clinic staff is available to answer your questions during regular business hours.  Please don’t hesitate to call and ask to speak to one of the nurses for clinical concerns.  If you have a medical emergency, go to the nearest emergency room or call 911.  A surgeon from [facility] is always on call at the hospital. °[address], [address], [place]  [address] ? [address] [address], [place], [place]   [address] °[phone] ? [phone] ? FAX [phone] °Web site:   [email or web address] °

## 2014-08-20 NOTE — Anesthesia Preprocedure Evaluation (Signed)
Anesthesia Evaluation  Patient identified by MRN, date of birth, ID band Patient awake    Reviewed: Allergy & Precautions, NPO status , Patient's Chart, lab work & pertinent test results  Airway Mallampati: II  TM Distance: >3 FB Neck ROM: Full    Dental no notable dental hx.    Pulmonary neg pulmonary ROS, former smoker,  breath sounds clear to auscultation  Pulmonary exam normal       Cardiovascular negative cardio ROS  Rhythm:Regular Rate:Normal     Neuro/Psych negative neurological ROS  negative psych ROS   GI/Hepatic Neg liver ROS, GERD-  Medicated,  Endo/Other  negative endocrine ROS  Renal/GU negative Renal ROS  negative genitourinary   Musculoskeletal negative musculoskeletal ROS (+)   Abdominal   Peds negative pediatric ROS (+)  Hematology negative hematology ROS (+)   Anesthesia Other Findings   Reproductive/Obstetrics negative OB ROS                             Anesthesia Physical Anesthesia Plan  ASA: II  Anesthesia Plan: General   Post-op Pain Management:    Induction: Intravenous  Airway Management Planned: Oral ETT  Additional Equipment:   Intra-op Plan:   Post-operative Plan: Extubation in OR  Informed Consent: I have reviewed the patients History and Physical, chart, labs and discussed the procedure including the risks, benefits and alternatives for the proposed anesthesia with the patient or authorized representative who has indicated his/her understanding and acceptance.   Dental advisory given  Plan Discussed with: CRNA and Surgeon  Anesthesia Plan Comments:         Anesthesia Quick Evaluation  

## 2014-08-20 NOTE — Anesthesia Postprocedure Evaluation (Signed)
  Anesthesia Post-op Note  Patient: Julia Shelton  Procedure(s) Performed: Procedure(s) (LRB): LAPAROSCOPIC CHOLECYSTECTOMY (N/A)  Patient Location: PACU  Anesthesia Type: General  Level of Consciousness: awake and alert   Airway and Oxygen Therapy: Patient Spontanous Breathing  Post-op Pain: mild  Post-op Assessment: Post-op Vital signs reviewed, Patient's Cardiovascular Status Stable, Respiratory Function Stable, Patent Airway and No signs of Nausea or vomiting  Last Vitals:  Filed Vitals:   08/20/14 1037  BP:   Pulse:   Temp: 36.3 C  Resp:     Post-op Vital Signs: stable   Complications: No apparent anesthesia complications

## 2014-08-20 NOTE — Anesthesia Procedure Notes (Signed)
Procedure Name: Intubation Date/Time: 08/20/2014 7:32 AM Performed by: Dairl PonderJIANG, Tariah Transue Pre-anesthesia Checklist: Patient identified, Timeout performed, Emergency Drugs available, Suction available and Patient being monitored Patient Re-evaluated:Patient Re-evaluated prior to inductionOxygen Delivery Method: Circle system utilized Preoxygenation: Pre-oxygenation with 100% oxygen Intubation Type: IV induction Ventilation: Mask ventilation without difficulty Laryngoscope Size: Mac and 3 Grade View: Grade I Tube type: Oral Tube size: 7.0 mm Number of attempts: 1 Placement Confirmation: ETT inserted through vocal cords under direct vision,  breath sounds checked- equal and bilateral and positive ETCO2 Secured at: 22 cm Tube secured with: Tape Dental Injury: Teeth and Oropharynx as per pre-operative assessment

## 2014-08-20 NOTE — Progress Notes (Signed)
Called Dr.Rose for sign out  

## 2014-08-20 NOTE — Op Note (Signed)
08/20/2014  8:31 AM  PATIENT:  Lelon MastSamantha Lyons-Kittrell  49 y.o. female  PRE-OPERATIVE DIAGNOSIS:  BILIARY DYSKENISIA  POST-OPERATIVE DIAGNOSIS:  BILIARY DYSKENISIA  PROCEDURE:  Procedure(s): LAPAROSCOPIC CHOLECYSTECTOMY (N/A)  SURGEON:  Surgeon(s) and Role:    * Axel FillerArmando Evander Macaraeg, MD - Primary   ANESTHESIA:   local and general  EBL:   <5cc  BLOOD ADMINISTERED:none  DRAINS: none   LOCAL MEDICATIONS USED:  BUPIVICAINE   SPECIMEN:  Source of Specimen:  gallbladder  DISPOSITION OF SPECIMEN:  PATHOLOGY  COUNTS:  YES  TOURNIQUET:  * No tourniquets in log *  DICTATION: .Dragon Dictation  Pre Operative Diagnosis: Biliary colic   Findings:mild chronically inflammed gallbladder  Indications for procedure: Pt is a [age] F with RUQ pain   Details of the procedure: The patient was taken to the operating and placed in the supine position with bilateral SCDs in place. A time out was called and all facts were verified. A pneumoperitoneum was obtained via A Veress needle technique to a pressure of 14mm of mercury. A 5mm trochar was then placed in the right upper quadrant under visualization, and there were no injuries to any abdominal organs. A 11 mm port was then placed in the umbilical region after infiltrating with local anesthesia under direct visualization. A second epigastric port was placed under direct visualization. The gallbladder was identified and retracted, the peritoneum was then sharply dissected from the gallbladder and this dissection was carried down to Calot's triangle. The cystic duct was identified and stripped away circumferentially and seen going into the gallbladder 360, the critical angle was obtained. 3 clips were placed proximally one distally and the cystic duct transected. The cystic artery was identified and 2 clips placed proximally and one distally and transected. We then proceeded to remove the gallbladder off the hepatic fossa with Bovie cautery. A retrieval bag  was then placed in the abdomen and gallbladder placed in the bag. The hepatic fossa was then reexamined and hemostasis was achieved with Bovie cautery and was excellent at this portion of the case. The subhepatic fossa and perihepatic fossa was then irrigated until the effluent was clear. The 11 mm trocar fascia was reapproximated with the Endo Close #1 Vicryl x2. The pneumoperitoneum was evacuated and all trochars removed under direct visulalization. The skin was then closed with 4-0 Monocryl and the skin dressed with Steri-Strips, gauze, and tape. The patient was awaken from general anesthesia and taken to the recovery room in stable condition.    PLAN OF CARE: Discharge to home after PACU  PATIENT DISPOSITION:  PACU - hemodynamically stable.   Delay start of Pharmacological VTE agent (>24hrs) due to surgical blood loss or risk of bleeding: not applicable

## 2014-08-20 NOTE — Progress Notes (Signed)
Received pt from PACU via stretcher.  Ambulated to BR without difficulty and was able to void successfully.  Pt assisted to chair and po's offered.  Husband at bedside.

## 2014-08-21 ENCOUNTER — Encounter (HOSPITAL_COMMUNITY): Payer: Self-pay | Admitting: General Surgery

## 2014-12-05 ENCOUNTER — Other Ambulatory Visit: Payer: Self-pay

## 2014-12-05 DIAGNOSIS — Z1231 Encounter for screening mammogram for malignant neoplasm of breast: Secondary | ICD-10-CM

## 2014-12-30 ENCOUNTER — Ambulatory Visit
Admission: RE | Admit: 2014-12-30 | Discharge: 2014-12-30 | Disposition: A | Discharge status: Home / Self Care | Payer: 59 | Source: Ambulatory Visit

## 2014-12-30 DIAGNOSIS — Z1231 Encounter for screening mammogram for malignant neoplasm of breast: Secondary | ICD-10-CM

## 2015-08-10 ENCOUNTER — Emergency Department (HOSPITAL_COMMUNITY): Payer: 59

## 2015-08-10 ENCOUNTER — Encounter (HOSPITAL_COMMUNITY): Payer: Self-pay | Admitting: Emergency Medicine

## 2015-08-10 ENCOUNTER — Emergency Department (HOSPITAL_COMMUNITY)
Admission: EM | Admit: 2015-08-10 | Discharge: 2015-08-10 | Disposition: A | Discharge status: Home / Self Care | Payer: 59 | Attending: Emergency Medicine | Admitting: Emergency Medicine

## 2015-08-10 DIAGNOSIS — R079 Chest pain, unspecified: Secondary | ICD-10-CM

## 2015-08-10 DIAGNOSIS — F419 Anxiety disorder, unspecified: Secondary | ICD-10-CM | POA: Diagnosis not present

## 2015-08-10 DIAGNOSIS — Z91040 Latex allergy status: Secondary | ICD-10-CM | POA: Diagnosis not present

## 2015-08-10 DIAGNOSIS — Z872 Personal history of diseases of the skin and subcutaneous tissue: Secondary | ICD-10-CM | POA: Diagnosis not present

## 2015-08-10 DIAGNOSIS — K219 Gastro-esophageal reflux disease without esophagitis: Secondary | ICD-10-CM | POA: Insufficient documentation

## 2015-08-10 DIAGNOSIS — Z8679 Personal history of other diseases of the circulatory system: Secondary | ICD-10-CM | POA: Insufficient documentation

## 2015-08-10 DIAGNOSIS — Z87891 Personal history of nicotine dependence: Secondary | ICD-10-CM | POA: Diagnosis not present

## 2015-08-10 DIAGNOSIS — Z8739 Personal history of other diseases of the musculoskeletal system and connective tissue: Secondary | ICD-10-CM | POA: Insufficient documentation

## 2015-08-10 DIAGNOSIS — Z8619 Personal history of other infectious and parasitic diseases: Secondary | ICD-10-CM | POA: Diagnosis not present

## 2015-08-10 LAB — BASIC METABOLIC PANEL
Anion gap: 11 (ref 5–15)
BUN: 14 mg/dL (ref 6–20)
CHLORIDE: 102 mmol/L (ref 101–111)
CO2: 27 mmol/L (ref 22–32)
Calcium: 8.9 mg/dL (ref 8.9–10.3)
Creatinine, Ser: 1.06 mg/dL — ABNORMAL HIGH (ref 0.44–1.00)
GFR calc Af Amer: >60 mL/min (ref >60)
GFR calc non Af Amer: >60 mL/min (ref >60)
GLUCOSE: 93 mg/dL (ref 65–99)
POTASSIUM: 3.8 mmol/L (ref 3.5–5.1)
Sodium: 140 mmol/L (ref 135–145)

## 2015-08-10 LAB — CBC
HEMATOCRIT: 39 % (ref 36.0–46.0)
Hemoglobin: 13.5 g/dL (ref 12.0–15.0)
MCH: 29.9 pg (ref 26.0–34.0)
MCHC: 34.6 g/dL (ref 30.0–36.0)
MCV: 86.5 fL (ref 78.0–100.0)
Platelets: 215 10*3/uL (ref 150–400)
RBC: 4.51 MIL/uL (ref 3.87–5.11)
RDW: 13.5 % (ref 11.5–15.5)
WBC: 10.5 10*3/uL (ref 4.0–10.5)

## 2015-08-10 LAB — I-STAT TROPONIN, ED: Troponin i, poc: 0 ng/mL (ref 0.00–0.08)

## 2015-08-10 LAB — LIPASE, BLOOD: LIPASE: 42 U/L (ref 11–51)

## 2015-08-10 NOTE — ED Provider Notes (Signed)
CSN: [id number]     Arrival date & time 08/10/15  1058 History   First MD Initiated Contact with Patient 08/10/15 1144     Chief Complaint  Patient presents with  . Chest Pain   (Consider location/radiation/quality/duration/timing/severity/associated sxs/prior Treatment) HPI 50 y.o. female with a hx of GERD, presents to the Emergency Department today complaining of chest pain last night. States that the pain was sudden and at rest while sitting in her car. Lasted for 2-3 min. Sharp. Substernal. No radiation. Went away for 5-10 min and then returned. Has been intermittent ever since. States she took her OTC GERD medication with some relief of symptoms. Woke up this morning with chest pain and came to ED to be evaluated. No hx MI. No FH MI. Only risk factor is smoking. States she stopped 2 years ago. No CP currently. No SOB/ABD pain. No N/V/D. No numbness/tingling. No other symptoms noted.      Past Medical History  Diagnosis Date  . History of blood transfusion as a teenager    no abnormal reaction noted  . Migraines   . HPV in female   . Bursitis     RIGHT HIP AND SHOULDER  . Nausea and vomiting     takes Zofran daily as needed  . History of migraine     last one about a yr ago  . GERD (gastroesophageal reflux disease)     takes Omeprazole daily as needed  . Sciatica   . Anxiety     takes Xanax daily as needed  . Eczema    Past Surgical History  Procedure Laterality Date  . Tubal ligation    . Wisdom tooth extraction    . Novasure ablation    . Ectopic pregnancies      x 2  . Esophagogastroduodenoscopy    . Cholecystectomy N/A 08/20/2014    Procedure: LAPAROSCOPIC CHOLECYSTECTOMY;  Surgeon: Axel Filler, MD;  Location: [facility] OR;  Service: General;  Laterality: N/A;   Family History  Problem Relation Age of Onset  . Hyperlipidemia Mother   . Asthma Mother   . Diabetes Father   . Hypertension Father   . Sickle cell trait Daughter   . Celiac disease Son    Social History   Substance Use Topics  . Smoking status: Former Games developer  . Smokeless tobacco: Never Used     Comment: quit smoking in Aug 2015  . Alcohol Use: Yes     Comment: wine   OB History    Gravida Para Term Preterm AB TAB SAB Ectopic Multiple Living   Review of Systems ROS reviewed and all are negative for acute change except as noted in the HPI.  Allergies  Biaxin and Latex  Home Medications   Prior to Admission medications   Medication Sig Start Date End Date Taking? Authorizing Provider  ALPRAZolam Prudy Feeler) 0.5 MG tablet Take 0.5 mg by mouth at bedtime as needed for anxiety or sleep.    Historical Provider, MD  hydrocortisone cream 1 % Apply 1 application topically daily as needed for itching. Eczema    Historical Provider, MD  omeprazole (PRILOSEC) 20 MG capsule Take 20 mg by mouth daily as needed (acid reflux).    Historical Provider, MD  oxyCODONE-acetaminophen (ROXICET) 5-325 MG per tablet Take 1-2 tablets by mouth every 4 (four) hours as needed. 08/20/14   Axel Filler, MD   BP 116/79 mmHg  Pulse  79  Temp(Src) 98.1 F (36.7 C) (Oral)  Resp 18  Ht  (1.626 m)  Wt 71.838 kg  BMI 27.17 kg/m2  SpO2 99%  LMP 07/29/2015   Physical Exam  Constitutional: She is oriented to person, place, and time. She appears well-developed and well-nourished.  HENT:  Head: Normocephalic and atraumatic.  Eyes: EOM are normal. Pupils are equal, round, and reactive to light.  Neck: Normal range of motion. Neck supple. No tracheal deviation present.  Cardiovascular: Normal rate, regular rhythm, normal heart sounds and intact distal pulses.   No murmur heard. Pulmonary/Chest: Effort normal and breath sounds normal. No respiratory distress. She has no wheezes. She has no rales. She exhibits no tenderness.  Abdominal: Soft. There is no tenderness.  Musculoskeletal: Normal range of motion.  Neurological: She is alert and oriented to person, place, and time.  Skin: Skin is  warm and dry.  Psychiatric: She has a normal mood and affect. Her behavior is normal. Thought content normal.  Nursing note and vitals reviewed.   ED Course  Procedures (including critical care time) Labs Review Labs Reviewed  BASIC METABOLIC PANEL - Abnormal; Notable for the following:    Creatinine, Ser 1.06 (*)    All other components within normal limits  CBC  LIPASE, BLOOD  I-STAT TROPOININ, ED  Rosezena Sensor, ED    Imaging Review Dg Chest 2 View  08/10/2015  CLINICAL DATA:  50 year old female with history of central chest pain in the inferior aspect of the chest which radiates superiorly for 1 day. EXAM: CHEST  2 VIEW COMPARISON:  Chest x-ray 02/09/2014. FINDINGS: Lung volumes are normal. No consolidative airspace disease. No pleural effusions. No pneumothorax. No pulmonary nodule or mass noted. Pulmonary vasculature and the cardiomediastinal silhouette are within normal limits. IMPRESSION: No radiographic evidence of acute cardiopulmonary disease. Electronically Signed   By: Trudie Reed M.D.   On: 08/10/2015 11:36   I have personally reviewed and evaluated these images and lab results as part of my medical decision-making.   EKG Interpretation   Date/Time:  Sunday August 10 2015 11:02:04 EDT Ventricular Rate:  86 PR Interval:  170 QRS Duration: 64 QT Interval:  362 QTC Calculation: 433 R Axis:   54 Text Interpretation:  Normal sinus rhythm Normal ECG No significant change  since last tracing Confirmed by Ethelda Chick  MD, SAM [id number]) on 08/10/2015  1:33:28 PM      MDM  I have reviewed and evaluated the relevant laboratory values.I have reviewed and evaluated the relevant imaging studies.I personally evaluated and interpreted the relevant EKG.I have reviewed the relevant previous healthcare records.I have reviewed EMS Documentation.I obtained HPI from historian. Patient discussed with supervising physician  ED Course:  Assessment: Pt is a 49yF presents with CP  since last night. Intermittent. Risk Factors smoking.  Patient is to be discharged with recommendation to follow up with PCP in regards to today's hospital visit. Chest pain is not likely of cardiac or pulmonary etiology d/t presentation, perc negative, VSS, no tracheal deviation, no JVD or new murmur, RRR, breath sounds equal bilaterally, EKG without acute abnormalities, negative troponin x2, and negative CXR. Lipase neg. Heart Score 2. Pt has been advised start a PPI and return to the ED is CP becomes exertional, associated with diaphoresis or nausea, radiates to left jaw/arm, worsens or becomes concerning in any way. Pt appears reliable for follow up and is agreeable to discharge. Patient is in no acute distress. Vital Signs are stable. Patient is able  to ambulate. Patient able to tolerate PO.    Disposition/Plan:  DC Home Additional Verbal discharge instructions given and discussed with patient.  Pt Instructed to f/u with PCP in the next 48-72 hours for evaluation and treatment of symptoms. Return precautions given Pt acknowledges and agrees with plan  Supervising Physician Doug SouSam Jacubowitz, MD   Final diagnoses:  Chest pain, unspecified chest pain type       Audry Piliyler Symphonie Schneiderman, PA-C 08/10/15 1502  Doug SouSam Jacubowitz, MD 08/10/15 [phone]

## 2015-08-10 NOTE — ED Notes (Signed)
MD at bedside. 

## 2015-08-10 NOTE — Discharge Instructions (Signed)
Please read and follow all provided instructions.  Your diagnoses today include:  1. Chest pain, unspecified chest pain type    Tests performed today include:  An EKG of your heart  A chest x-ray  Cardiac enzymes - a blood test for heart muscle damage  Blood counts and electrolytes  Vital signs. See below for your results today.   Medications prescribed:   Take any prescribed medications only as directed.  Follow-up instructions: Please follow-up with your primary care provider as soon as you can for further evaluation of your symptoms.   Return instructions:  SEEK IMMEDIATE MEDICAL ATTENTION IF:  You have severe chest pain, especially if the pain is crushing or pressure-like and spreads to the arms, back, neck, or jaw, or if you have sweating, nausea (feeling sick to your stomach), or shortness of breath. THIS IS AN EMERGENCY. Don't wait to see if the pain will go away. Get medical help at once. Call 911 or 0 (operator). DO NOT drive yourself to the hospital.   Your chest pain gets worse and does not go away with rest.   You have an attack of chest pain lasting longer than usual, despite rest and treatment with the medications your caregiver has prescribed.   You wake from sleep with chest pain or shortness of breath.  You feel dizzy or faint.  You have chest pain not typical of your usual pain for which you originally saw your caregiver.   You have any other emergent concerns regarding your health.  Additional Information: Chest pain comes from many different causes. Your caregiver has diagnosed you as having chest pain that is not specific for one problem, but does not require admission.  You are at low risk for an acute heart condition or other serious illness.   Your vital signs today were: BP 106/71 mmHg   Pulse 72   Temp(Src) 98.1 F (36.7 C) (Oral)   Resp 16   Ht 5\' 4"  (1.626 m)   Wt 71.838 kg   BMI 27.17 kg/m2   SpO2 100%   LMP 07/29/2015 If your blood pressure  (BP) was elevated above 135/85 this visit, please have this repeated by your doctor within one month. --------------

## 2015-08-10 NOTE — ED Notes (Signed)
Pt c/o center chest pain onset last night while riding in the car. Pt denies any other symptoms.

## 2015-08-10 NOTE — ED Provider Notes (Signed)
Complains of epigastric pain radiating to substernal area onset yesterday lasting 2 minutes at a time. Not made better or worse by anything. No associated nausea shortness of breath or sweatiness. Pain is nonexertional. She is presently asymptomatic. No treatment prior to coming here on exam no distress lungs clear to auscultation heart regular rate and rhythm abdomen nondistended normoactive bowel sounds minimally tender at epigastrium no guarding rigidity or rebound  Doug SouSam Crosley Stejskal, MD 08/10/15 1407

## 2015-08-11 ENCOUNTER — Other Ambulatory Visit: Payer: Self-pay | Admitting: Gastroenterology

## 2015-08-11 DIAGNOSIS — R1013 Epigastric pain: Secondary | ICD-10-CM

## 2015-08-11 LAB — I-STAT TROPONIN, ED: Troponin i, poc: 0 ng/mL (ref 0.00–0.08)

## 2015-08-11 LAB — HEPATIC FUNCTION PANEL
ALBUMIN: 3.7 g/dL (ref 3.5–5.0)
ALK PHOS: 36 U/L — AB (ref 38–126)
ALT: 20 U/L (ref 14–54)
AST: 22 U/L (ref 15–41)
BILIRUBIN DIRECT: 0.2 mg/dL (ref 0.1–0.5)
BILIRUBIN INDIRECT: 0.8 mg/dL (ref 0.3–0.9)
BILIRUBIN TOTAL: 1 mg/dL (ref 0.3–1.2)
Total Protein: 6.6 g/dL (ref 6.5–8.1)

## 2015-12-31 ENCOUNTER — Other Ambulatory Visit: Payer: Self-pay | Admitting: Obstetrics and Gynecology

## 2015-12-31 DIAGNOSIS — Z1231 Encounter for screening mammogram for malignant neoplasm of breast: Secondary | ICD-10-CM

## 2016-01-23 ENCOUNTER — Ambulatory Visit
Admission: RE | Admit: 2016-01-23 | Discharge: 2016-01-23 | Disposition: A | Discharge status: Home / Self Care | Payer: 59 | Source: Ambulatory Visit | Attending: Obstetrics and Gynecology | Admitting: Obstetrics and Gynecology

## 2016-01-23 DIAGNOSIS — Z1231 Encounter for screening mammogram for malignant neoplasm of breast: Secondary | ICD-10-CM

## 2016-08-24 ENCOUNTER — Other Ambulatory Visit: Payer: Self-pay | Admitting: Endocrinology

## 2016-08-24 DIAGNOSIS — E221 Hyperprolactinemia: Secondary | ICD-10-CM

## 2016-09-05 ENCOUNTER — Other Ambulatory Visit: Payer: Self-pay | Admitting: Endocrinology

## 2016-09-05 ENCOUNTER — Ambulatory Visit
Admission: RE | Admit: 2016-09-05 | Discharge: 2016-09-05 | Disposition: A | Discharge status: Home / Self Care | Payer: 59 | Source: Ambulatory Visit | Attending: Endocrinology | Admitting: Endocrinology

## 2016-09-05 DIAGNOSIS — E221 Hyperprolactinemia: Secondary | ICD-10-CM | POA: Diagnosis not present

## 2016-09-08 DIAGNOSIS — L659 Nonscarring hair loss, unspecified: Secondary | ICD-10-CM | POA: Diagnosis not present

## 2016-09-13 DIAGNOSIS — L639 Alopecia areata, unspecified: Secondary | ICD-10-CM | POA: Diagnosis not present

## 2016-09-16 DIAGNOSIS — E221 Hyperprolactinemia: Secondary | ICD-10-CM | POA: Diagnosis not present

## 2016-09-16 DIAGNOSIS — L659 Nonscarring hair loss, unspecified: Secondary | ICD-10-CM | POA: Diagnosis not present

## 2016-10-15 DIAGNOSIS — H9312 Tinnitus, left ear: Secondary | ICD-10-CM | POA: Diagnosis not present

## 2016-11-23 DIAGNOSIS — J343 Hypertrophy of nasal turbinates: Secondary | ICD-10-CM | POA: Diagnosis not present

## 2016-11-23 DIAGNOSIS — H9012 Conductive hearing loss, unilateral, left ear, with unrestricted hearing on the contralateral side: Secondary | ICD-10-CM | POA: Diagnosis not present

## 2016-11-23 DIAGNOSIS — H6983 Other specified disorders of Eustachian tube, bilateral: Secondary | ICD-10-CM | POA: Diagnosis not present

## 2016-12-13 DIAGNOSIS — L639 Alopecia areata, unspecified: Secondary | ICD-10-CM | POA: Diagnosis not present

## 2016-12-17 DIAGNOSIS — E221 Hyperprolactinemia: Secondary | ICD-10-CM | POA: Diagnosis not present

## 2017-03-10 DIAGNOSIS — E221 Hyperprolactinemia: Secondary | ICD-10-CM | POA: Diagnosis not present

## 2017-03-17 DIAGNOSIS — L659 Nonscarring hair loss, unspecified: Secondary | ICD-10-CM | POA: Diagnosis not present

## 2017-03-17 DIAGNOSIS — E221 Hyperprolactinemia: Secondary | ICD-10-CM | POA: Diagnosis not present

## 2017-03-23 DIAGNOSIS — Z01419 Encounter for gynecological examination (general) (routine) without abnormal findings: Secondary | ICD-10-CM | POA: Diagnosis not present

## 2017-03-29 DIAGNOSIS — Z1212 Encounter for screening for malignant neoplasm of rectum: Secondary | ICD-10-CM | POA: Diagnosis not present

## 2017-04-04 DIAGNOSIS — M7711 Lateral epicondylitis, right elbow: Secondary | ICD-10-CM | POA: Diagnosis not present

## 2017-04-20 DIAGNOSIS — M25521 Pain in right elbow: Secondary | ICD-10-CM | POA: Diagnosis not present

## 2017-04-20 DIAGNOSIS — M25421 Effusion, right elbow: Secondary | ICD-10-CM | POA: Diagnosis not present

## 2017-04-20 DIAGNOSIS — M7711 Lateral epicondylitis, right elbow: Secondary | ICD-10-CM | POA: Diagnosis not present

## 2017-06-23 DIAGNOSIS — N390 Urinary tract infection, site not specified: Secondary | ICD-10-CM | POA: Diagnosis not present

## 2017-07-12 DIAGNOSIS — L639 Alopecia areata, unspecified: Secondary | ICD-10-CM | POA: Diagnosis not present

## 2017-09-05 DIAGNOSIS — L639 Alopecia areata, unspecified: Secondary | ICD-10-CM | POA: Diagnosis not present

## 2017-09-24 DIAGNOSIS — M79645 Pain in left finger(s): Secondary | ICD-10-CM | POA: Diagnosis not present

## 2017-09-24 DIAGNOSIS — M779 Enthesopathy, unspecified: Secondary | ICD-10-CM | POA: Diagnosis not present

## 2017-10-11 DIAGNOSIS — Z719 Counseling, unspecified: Secondary | ICD-10-CM | POA: Diagnosis not present

## 2018-02-22 DIAGNOSIS — J309 Allergic rhinitis, unspecified: Secondary | ICD-10-CM | POA: Diagnosis not present

## 2018-03-01 DIAGNOSIS — Z23 Encounter for immunization: Secondary | ICD-10-CM | POA: Diagnosis not present

## 2018-03-09 DIAGNOSIS — L659 Nonscarring hair loss, unspecified: Secondary | ICD-10-CM | POA: Diagnosis not present

## 2018-03-09 DIAGNOSIS — E221 Hyperprolactinemia: Secondary | ICD-10-CM | POA: Diagnosis not present

## 2018-03-16 DIAGNOSIS — L659 Nonscarring hair loss, unspecified: Secondary | ICD-10-CM | POA: Diagnosis not present

## 2018-03-16 DIAGNOSIS — E221 Hyperprolactinemia: Secondary | ICD-10-CM | POA: Diagnosis not present

## 2018-04-12 DIAGNOSIS — L639 Alopecia areata, unspecified: Secondary | ICD-10-CM | POA: Diagnosis not present

## 2018-04-21 DIAGNOSIS — Z01419 Encounter for gynecological examination (general) (routine) without abnormal findings: Secondary | ICD-10-CM | POA: Diagnosis not present

## 2018-04-21 DIAGNOSIS — R350 Frequency of micturition: Secondary | ICD-10-CM | POA: Diagnosis not present

## 2018-04-21 DIAGNOSIS — Z6826 Body mass index (BMI) 26.0-26.9, adult: Secondary | ICD-10-CM | POA: Diagnosis not present

## 2018-05-10 DIAGNOSIS — L639 Alopecia areata, unspecified: Secondary | ICD-10-CM | POA: Diagnosis not present

## 2019-04-05 ENCOUNTER — Other Ambulatory Visit: Payer: Self-pay

## 2019-04-05 DIAGNOSIS — Z20822 Contact with and (suspected) exposure to covid-19: Secondary | ICD-10-CM

## 2019-04-07 LAB — NOVEL CORONAVIRUS, NAA: SARS-CoV-2, NAA: NOT DETECTED

## 2021-09-07 ENCOUNTER — Other Ambulatory Visit: Payer: Self-pay | Admitting: Endocrinology

## 2021-09-07 DIAGNOSIS — E010 Iodine-deficiency related diffuse (endemic) goiter: Secondary | ICD-10-CM

## 2021-09-10 ENCOUNTER — Ambulatory Visit
Admission: RE | Admit: 2021-09-10 | Discharge: 2021-09-10 | Disposition: A | Discharge status: Home / Self Care | Payer: 59 | Source: Ambulatory Visit | Attending: Endocrinology | Admitting: Endocrinology

## 2021-09-10 DIAGNOSIS — E010 Iodine-deficiency related diffuse (endemic) goiter: Secondary | ICD-10-CM

## 2021-09-16 ENCOUNTER — Other Ambulatory Visit: Payer: Self-pay | Admitting: Sports Medicine

## 2021-09-16 DIAGNOSIS — J211 Acute bronchiolitis due to human metapneumovirus: Secondary | ICD-10-CM

## 2021-09-20 ENCOUNTER — Ambulatory Visit
Admission: RE | Admit: 2021-09-20 | Discharge: 2021-09-20 | Disposition: A | Discharge status: Home / Self Care | Payer: 59 | Source: Ambulatory Visit | Attending: Sports Medicine | Admitting: Sports Medicine

## 2021-09-20 DIAGNOSIS — J211 Acute bronchiolitis due to human metapneumovirus: Secondary | ICD-10-CM

## 2022-02-17 ENCOUNTER — Other Ambulatory Visit: Payer: Self-pay | Admitting: Family Medicine

## 2022-02-17 DIAGNOSIS — E049 Nontoxic goiter, unspecified: Secondary | ICD-10-CM

## 2022-03-04 ENCOUNTER — Ambulatory Visit
Admission: RE | Admit: 2022-03-04 | Discharge: 2022-03-04 | Disposition: A | Discharge status: Home / Self Care | Payer: 59 | Source: Ambulatory Visit | Attending: Family Medicine | Admitting: Family Medicine

## 2022-03-04 DIAGNOSIS — E049 Nontoxic goiter, unspecified: Secondary | ICD-10-CM

## 2022-08-14 IMAGING — MR MR LUMBAR SPINE W/O CM
4 of 5 series · 27 of 48 positions shown · non-contrast
Comparison: None.

CLINICAL DATA: Bilateral low back pain radiating to the hips and
legs.

EXAM:
MRI LUMBAR SPINE WITHOUT CONTRAST
TECHNIQUE: Multiplanar, multisequence MR imaging of the lumbar spine was
performed. No intravenous contrast was administered.

[Series 2: T2 · sagittal · 4.0mm · 0.53mm/px · 6 of 13 slices shown (1 of 2)]
[im 1/13]
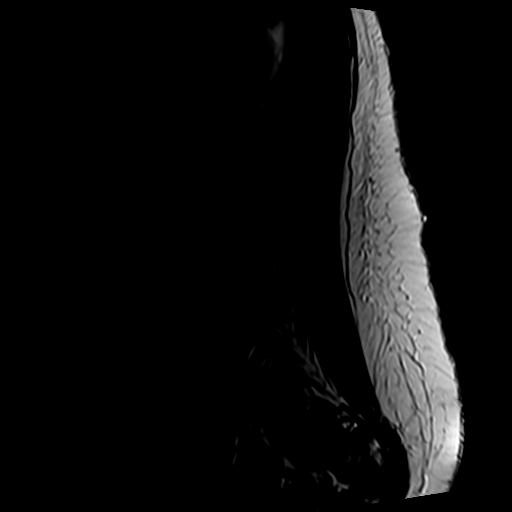
[im 3/13]
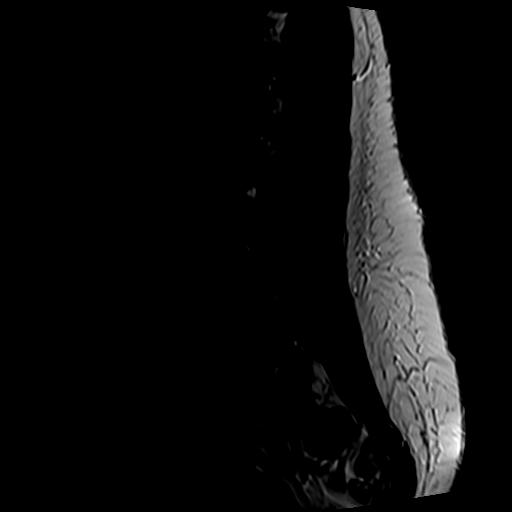
[im 5/13]
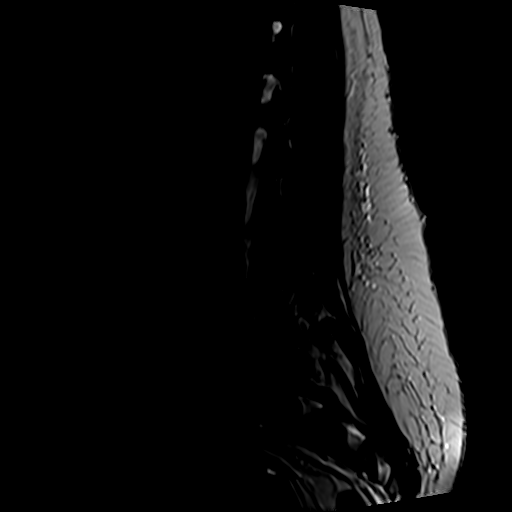
[im 8/13]
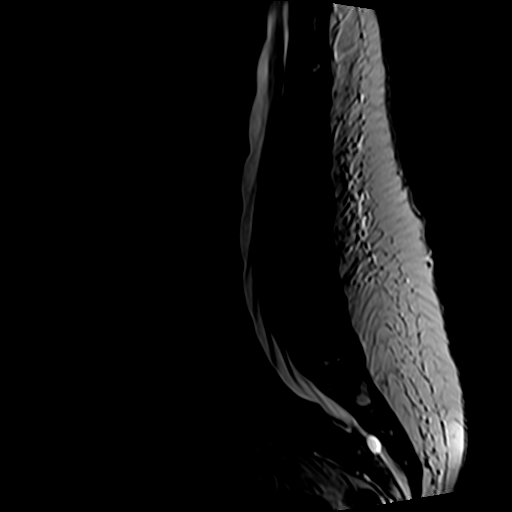
[im 10/13]
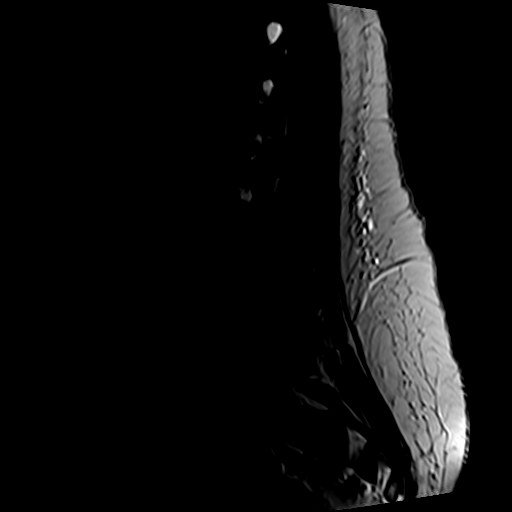
[im 13/13]
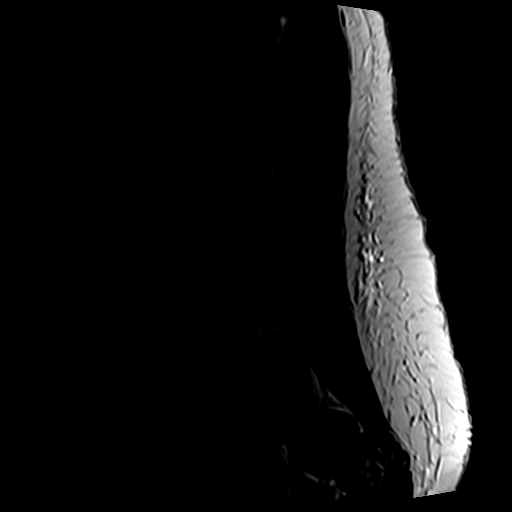

[Series 4: T1 · sagittal · 4.0mm · 0.53mm/px · 5 of 13 slices shown (1 of 2)]
[im 1/13]
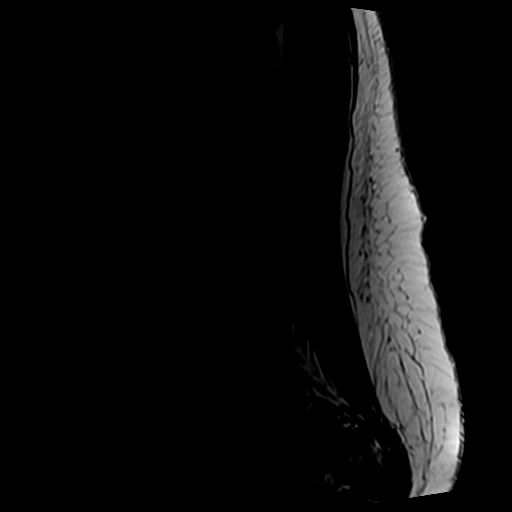
[im 4/13]
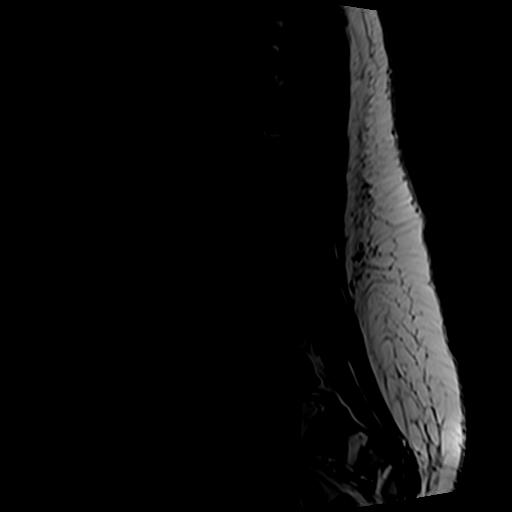
[im 7/13]
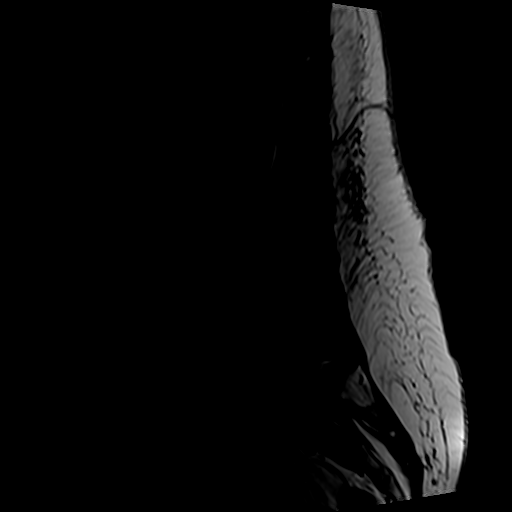
[im 10/13]
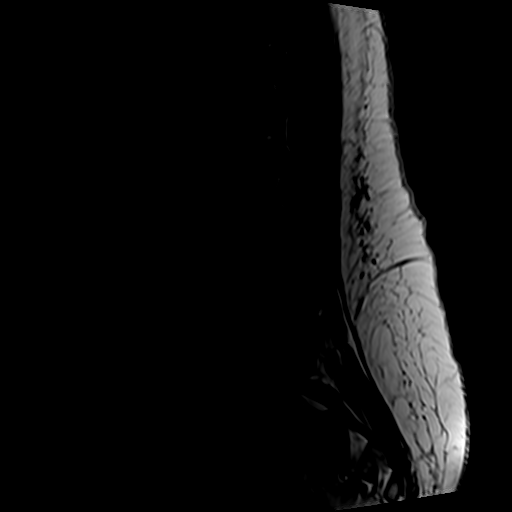
[im 13/13]
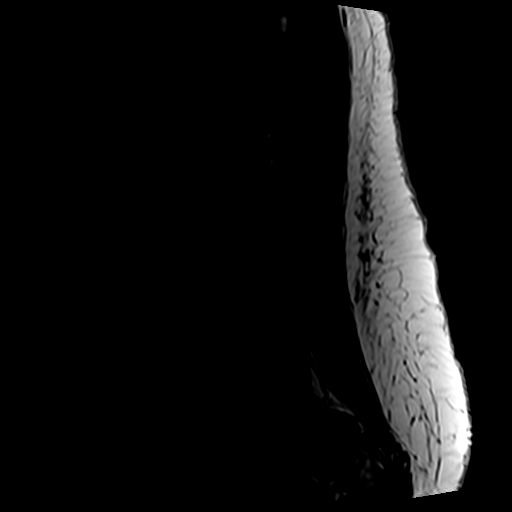

[Series 5: T2 · axial · 4.0mm · 0.70mm/px · z∈[-93,+107]mm · 10 of 38 slices shown (2 of 2)]
[im 3/38]
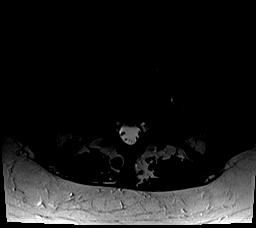
[im 5/38]
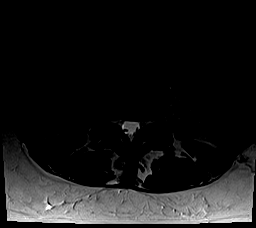
[im 8/38]
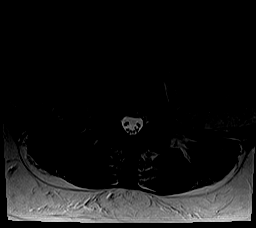
[im 13/38]
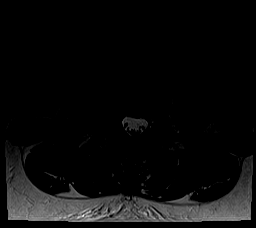
[im 18/38]
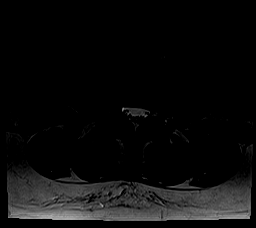
[im 20/38]
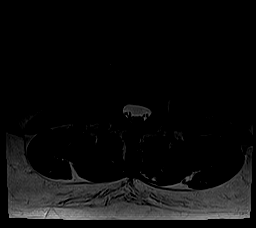
[im 23/38]
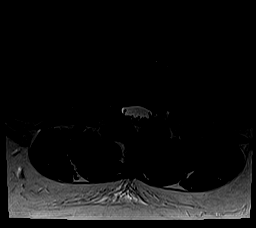
[im 28/38]
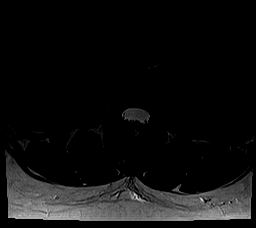
[im 33/38]
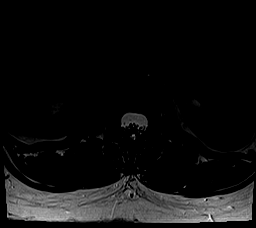
[im 38/38]
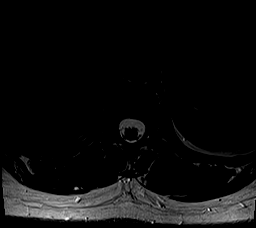

[Series 6: T1 · axial · 4.0mm · 0.35mm/px · z∈[-93,+82]mm · 6 of 38 slices shown (2 of 2)]
[im 3/38]
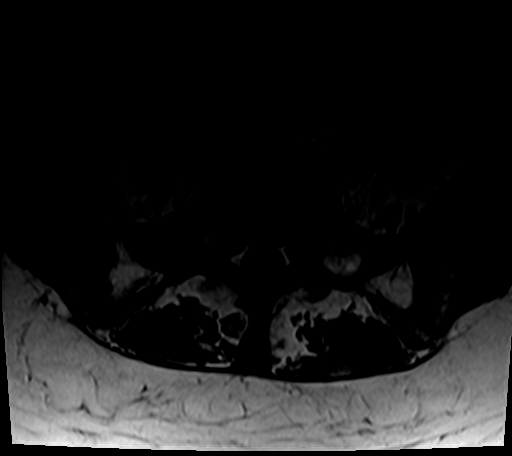
[im 5/38]
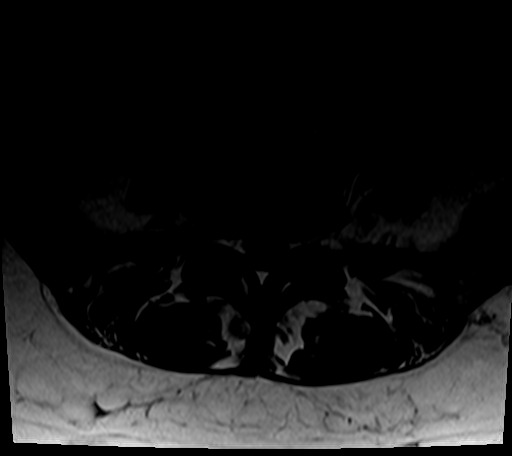
[im 8/38]
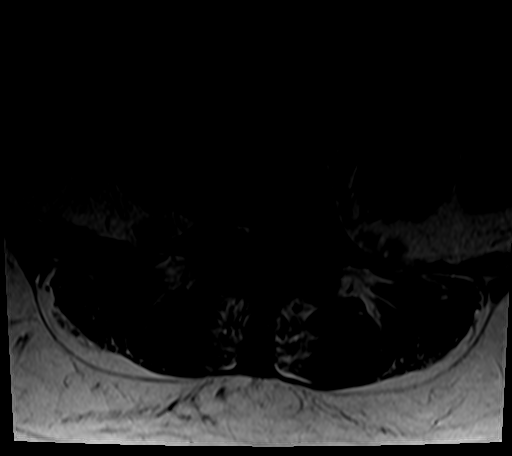
[im 13/38]
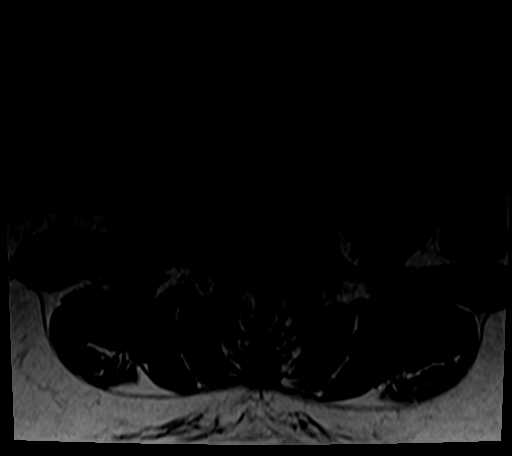
[im 20/38]
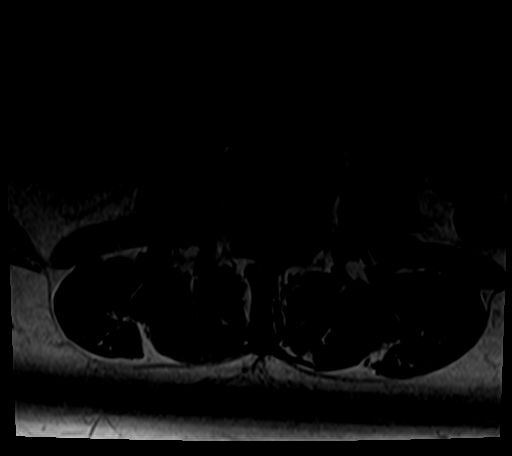
[im 33/38]
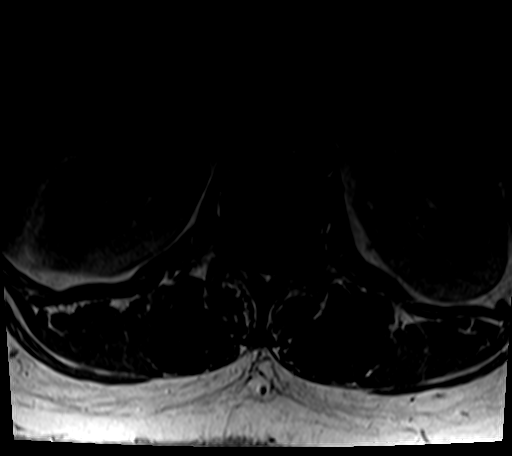

[27 of 48 positions shown; findings below may reference images not displayed]

FINDINGS: Segmentation:  5 lumbar type vertebrae

Alignment:  Physiologic.

Vertebrae: Marrow edema at the right L4-5 facet. No fracture,
discitis, or aggressive bone lesion

Conus medullaris and cauda equina: Conus extends to the L1 level.
Conus and cauda equina appear normal.

Paraspinal and other soft tissues: Negative for perispinal mass or
inflammation

Disc levels:

T11-12 and T12-L1 bilateral foraminal root sleeve cysts. No
impingement.

L1-L2: Tiny central protrusion

L2-L3: No degenerative changes or impingement. Small foraminal root
sleeve cysts

L3-L4: Mild disc bulging.

L4-L5: Facet spurring and ligamentum flavum thickening. Mild disc
bulging. No neural impingement

L5-S1:Borderline marginal facet spurring. No herniation or
impingement
IMPRESSION: Mild degeneration, most notable at the L4-5 facets where there is
mild marrow edema on the right. No neural impingement.

## 2023-09-15 DIAGNOSIS — R7309 Other abnormal glucose: Secondary | ICD-10-CM | POA: Diagnosis not present

## 2023-09-15 DIAGNOSIS — Z13220 Encounter for screening for lipoid disorders: Secondary | ICD-10-CM | POA: Diagnosis not present

## 2023-09-15 DIAGNOSIS — R946 Abnormal results of thyroid function studies: Secondary | ICD-10-CM | POA: Diagnosis not present

## 2023-10-02 DIAGNOSIS — S0592XA Unspecified injury of left eye and orbit, initial encounter: Secondary | ICD-10-CM | POA: Diagnosis not present

## 2023-10-05 DIAGNOSIS — Z0000 Encounter for general adult medical examination without abnormal findings: Secondary | ICD-10-CM | POA: Diagnosis not present

## 2023-10-05 DIAGNOSIS — M79674 Pain in right toe(s): Secondary | ICD-10-CM | POA: Diagnosis not present

## 2023-10-05 DIAGNOSIS — H6120 Impacted cerumen, unspecified ear: Secondary | ICD-10-CM | POA: Diagnosis not present

## 2023-10-05 DIAGNOSIS — R7309 Other abnormal glucose: Secondary | ICD-10-CM | POA: Diagnosis not present

## 2023-10-25 ENCOUNTER — Ambulatory Visit (INDEPENDENT_AMBULATORY_CARE_PROVIDER_SITE_OTHER): Admitting: Podiatry

## 2023-10-25 ENCOUNTER — Encounter: Payer: Self-pay | Admitting: Podiatry

## 2023-10-25 DIAGNOSIS — L600 Ingrowing nail: Secondary | ICD-10-CM | POA: Diagnosis not present

## 2023-10-25 DIAGNOSIS — B351 Tinea unguium: Secondary | ICD-10-CM | POA: Diagnosis not present

## 2023-10-25 DIAGNOSIS — L603 Nail dystrophy: Secondary | ICD-10-CM | POA: Diagnosis not present

## 2023-10-25 NOTE — Progress Notes (Signed)
 Subjective:  Patient ID: Julia Shelton, female    DOB: 1965-11-29,   MRN: [id number]  Chief Complaint  Patient presents with   Nail Problem    "I have right toe pain." N - toe pain L - Hallux right medial border toenail D - 3-4 mos O - suddenly C -sharp pain A - shoes, covers T - ingrown toenail med    58 y.o. female presents for concern as above . Denies any other pedal complaints. Denies n/v/f/c.   Past Medical History:  Diagnosis Date   Anxiety    takes Xanax  daily as needed   Bursitis    RIGHT HIP AND SHOULDER   Eczema    GERD (gastroesophageal reflux disease)    takes Omeprazole  daily as needed   History of blood transfusion as a teenager   no abnormal reaction noted   History of migraine    last one about a yr ago   HPV in female    Migraines    Nausea and vomiting    takes Zofran  daily as needed   Sciatica     Objective:  Physical Exam: Vascular: DP/PT pulses 2/4 bilateral. CFT <3 seconds. Normal hair growth on digits. No edema.  Skin. No lacerations or abrasions bilateral feet. Incurvation of medial border of right great toenail. No erythema edema or purulence noted. Mildly tender to palpation Right hallux nail some mild color changes and thickened noted as well.  Musculoskeletal: MMT 5/5 bilateral lower extremities in DF, PF, Inversion and Eversion. Deceased ROM in DF of ankle joint.  Neurological: Sensation intact to light touch.   Assessment:   1. Ingrown right greater toenail [L60.0]   2. Onychomycosis      Plan:  Patient was evaluated and treated and all questions answered. Discussed ingrown toenails etiology and treatment options including procedure for removal vs conservative care.  Patient requesting removal of ingrown nail today. Procedure below.  Discussed procedure and post procedure care and patient expressed understanding.   -Examined patient -Discussed treatment options for painful dystrophic nails  -Clinical picture and  Fungal culture was obtained by removing a portion of the hard nail itself from each of the involved toenails using a sterile nail nipper and sent to [facility] lab. Patient tolerated the biopsy procedure well without discomfort or need for anesthesia.  -Discussed fungal nail treatment options including oral, topical, and laser treatments.  -Patient to return in 4 weeks for follow up evaluation and discussion of fungal culture results or sooner if symptoms worsen.    Procedure:  Procedure: partial Nail Avulsion of right hallux medial nail border.  Surgeon: Jennefer Moats, DPM  Pre-op Dx: Ingrown toenail without infection Post-op: Same  Place of Surgery: Office exam room.  Indications for surgery: Painful and ingrown toenail.    The patient is requesting removal of nail with  chemical matrixectomy. Risks and complications were discussed with the patient for which they understand and written consent was obtained. Under sterile conditions a total of 3 mL of  1% lidocaine  plain was infiltrated in a hallux block fashion. Once anesthetized, the skin was prepped in sterile fashion. A tourniquet was then applied. Next the medial aspect of hallux nail border was then sharply excised making sure to remove the entire offending nail border.  Next phenol was then applied under standard conditions to permanently destroy the matrix and copiously irrigated. Silvadene was applied. A dry sterile dressing was applied. After application of the dressing the tourniquet was removed and there is found  to be an immediate capillary refill time to the digit. The patient tolerated the procedure well without any complications. Post procedure instructions were discussed the patient for which he verbally understood. Follow-up in two weeks for nail check or sooner if any problems are to arise. Discussed signs/symptoms of infection and directed to call the office immediately should any occur or go directly to the emergency room. In the  meantime, encouraged to call the office with any questions, concerns, changes symptoms.   Jennefer Moats, DPM

## 2023-10-25 NOTE — Patient Instructions (Signed)

## 2023-10-25 NOTE — Addendum Note (Signed)
 Addended by: Koreen Person on: 10/25/2023 11:31 AM   Modules accepted: Orders

## 2023-11-22 ENCOUNTER — Encounter: Payer: Self-pay | Admitting: Podiatry

## 2023-11-22 ENCOUNTER — Ambulatory Visit (INDEPENDENT_AMBULATORY_CARE_PROVIDER_SITE_OTHER): Admitting: Podiatry

## 2023-11-22 DIAGNOSIS — L600 Ingrowing nail: Secondary | ICD-10-CM

## 2023-11-22 DIAGNOSIS — B351 Tinea unguium: Secondary | ICD-10-CM

## 2023-11-22 NOTE — Progress Notes (Signed)
  Subjective:  Patient ID: Julia Shelton, female    DOB: 28-Dec-1965,   MRN: [id number]  Chief Complaint  Patient presents with   Follow-up    Patient states that everything has been going great since last visit    58 y.o. female presents for follow-up of right great toe ingrown nail procedure and to follow-up on fungal culture. Relates doing well no pain and has finished soaks.  . Denies any other pedal complaints. Denies n/v/f/c.   Past Medical History:  Diagnosis Date   Anxiety    takes Xanax  daily as needed   Bursitis    RIGHT HIP AND SHOULDER   Eczema    GERD (gastroesophageal reflux disease)    takes Omeprazole  daily as needed   History of blood transfusion as a teenager   no abnormal reaction noted   History of migraine    last one about a yr ago   HPV in female    Migraines    Nausea and vomiting    takes Zofran  daily as needed   Sciatica     Objective:  Physical Exam: Vascular: DP/PT pulses 2/4 bilateral. CFT <3 seconds. Normal hair growth on digits. No edema.  Skin. No lacerations or abrasions bilateral feet. Right hallux nail well healed.  Musculoskeletal: MMT 5/5 bilateral lower extremities in DF, PF, Inversion and Eversion. Deceased ROM in DF of ankle joint.  Neurological: Sensation intact to light touch.   Assessment:   1. Onychomycosis   2. Ingrown right greater toenail [L60.0]       Plan:  Patient was evaluated and treated and all questions answered. Toe was evaluated and appears to be healing well.  May discontinue soaks and neosporin.   -Discussed treatment options for painful dystrophic nails  -Culture negative for fungus.   -Discussed shoe gear modification and nail softeners.  -Patient to return as needed   Asberry Failing, DPM

## 2024-02-27 DIAGNOSIS — Z23 Encounter for immunization: Secondary | ICD-10-CM | POA: Diagnosis not present

## 2024-02-27 DIAGNOSIS — R109 Unspecified abdominal pain: Secondary | ICD-10-CM | POA: Diagnosis not present

## 2024-03-07 DIAGNOSIS — M2142 Flat foot [pes planus] (acquired), left foot: Secondary | ICD-10-CM | POA: Diagnosis not present

## 2024-03-07 DIAGNOSIS — M25572 Pain in left ankle and joints of left foot: Secondary | ICD-10-CM | POA: Diagnosis not present

## 2024-03-07 DIAGNOSIS — M76829 Posterior tibial tendinitis, unspecified leg: Secondary | ICD-10-CM | POA: Diagnosis not present

## 2024-03-07 DIAGNOSIS — S82892A Other fracture of left lower leg, initial encounter for closed fracture: Secondary | ICD-10-CM | POA: Diagnosis not present

## 2024-04-11 DIAGNOSIS — T8484XA Pain due to internal orthopedic prosthetic devices, implants and grafts, initial encounter: Secondary | ICD-10-CM | POA: Diagnosis not present

## 2024-04-11 DIAGNOSIS — M79672 Pain in left foot: Secondary | ICD-10-CM | POA: Diagnosis not present

## 2024-04-11 DIAGNOSIS — G8929 Other chronic pain: Secondary | ICD-10-CM | POA: Diagnosis not present

## 2024-04-11 DIAGNOSIS — M76822 Posterior tibial tendinitis, left leg: Secondary | ICD-10-CM | POA: Diagnosis not present

## 2024-04-20 DIAGNOSIS — T8484XA Pain due to internal orthopedic prosthetic devices, implants and grafts, initial encounter: Secondary | ICD-10-CM | POA: Diagnosis not present
